# Patient Record
Sex: Female | Born: 1960 | Race: White | Hispanic: No | Marital: Married | Smoking: Former smoker
Health system: Southern US, Community
[De-identification: ages and names within clinical notes are randomized; demographics above are authoritative.]

## PROBLEM LIST (undated history)

## (undated) DIAGNOSIS — E079 Disorder of thyroid, unspecified: Secondary | ICD-10-CM

## (undated) DIAGNOSIS — G43909 Migraine, unspecified, not intractable, without status migrainosus: Secondary | ICD-10-CM

## (undated) DIAGNOSIS — C50911 Malignant neoplasm of unspecified site of right female breast: Secondary | ICD-10-CM

## (undated) DIAGNOSIS — F419 Anxiety disorder, unspecified: Secondary | ICD-10-CM

## (undated) DIAGNOSIS — N809 Endometriosis, unspecified: Secondary | ICD-10-CM

## (undated) DIAGNOSIS — E785 Hyperlipidemia, unspecified: Secondary | ICD-10-CM

## (undated) DIAGNOSIS — Z85850 Personal history of malignant neoplasm of thyroid: Secondary | ICD-10-CM

## (undated) HISTORY — DX: Endometriosis, unspecified: N80.9

## (undated) HISTORY — PX: ABDOMINAL HYSTERECTOMY: SHX81

## (undated) HISTORY — DX: Anxiety disorder, unspecified: F41.9

## (undated) HISTORY — DX: Migraine, unspecified, not intractable, without status migrainosus: G43.909

## (undated) HISTORY — DX: Disorder of thyroid, unspecified: E07.9

## (undated) HISTORY — PX: BREAST RECONSTRUCTION WITH PLACEMENT OF TISSUE EXPANDER AND FLEX HD (ACELLULAR HYDRATED DERMIS): SHX6295

## (undated) HISTORY — PX: TONSILLECTOMY AND ADENOIDECTOMY: SUR1326

## (undated) HISTORY — DX: Hyperlipidemia, unspecified: E78.5

## (undated) HISTORY — PX: ENDOMETRIAL ABLATION: SHX621

## (undated) HISTORY — DX: Malignant neoplasm of unspecified site of right female breast: C50.911

## (undated) HISTORY — PX: BREAST SURGERY: SHX581

## (undated) HISTORY — DX: Personal history of malignant neoplasm of thyroid: Z85.850

---
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
## 2005-01-17 HISTORY — PX: LAMINECTOMY: SHX219

## 2005-02-16 ENCOUNTER — Emergency Department (HOSPITAL_COMMUNITY): Admission: EM | Admit: 2005-02-16 | Discharge: 2005-02-16 | Payer: Self-pay | Admitting: Emergency Medicine

## 2005-03-07 ENCOUNTER — Ambulatory Visit (HOSPITAL_COMMUNITY): Admission: RE | Admit: 2005-03-07 | Discharge: 2005-03-08 | Payer: Self-pay | Admitting: Neurosurgery

## 2005-11-07 ENCOUNTER — Encounter (INDEPENDENT_AMBULATORY_CARE_PROVIDER_SITE_OTHER): Payer: Self-pay | Admitting: *Deleted

## 2005-11-07 ENCOUNTER — Encounter: Admission: RE | Admit: 2005-11-07 | Discharge: 2005-11-07 | Payer: Self-pay | Admitting: Family Medicine

## 2005-11-16 ENCOUNTER — Encounter: Admission: RE | Admit: 2005-11-16 | Discharge: 2005-11-16 | Payer: Self-pay | Admitting: Family Medicine

## 2005-11-21 ENCOUNTER — Ambulatory Visit: Payer: Self-pay | Admitting: Oncology

## 2005-11-24 ENCOUNTER — Ambulatory Visit: Admission: RE | Admit: 2005-11-24 | Discharge: 2005-11-24 | Payer: Self-pay | Admitting: Oncology

## 2005-11-24 ENCOUNTER — Encounter (INDEPENDENT_AMBULATORY_CARE_PROVIDER_SITE_OTHER): Payer: Self-pay | Admitting: *Deleted

## 2005-11-25 ENCOUNTER — Ambulatory Visit (HOSPITAL_COMMUNITY): Admission: RE | Admit: 2005-11-25 | Discharge: 2005-11-25 | Payer: Self-pay | Admitting: Oncology

## 2005-11-28 ENCOUNTER — Ambulatory Visit (HOSPITAL_COMMUNITY): Admission: RE | Admit: 2005-11-28 | Discharge: 2005-11-28 | Payer: Self-pay | Admitting: Oncology

## 2005-11-29 ENCOUNTER — Encounter (INDEPENDENT_AMBULATORY_CARE_PROVIDER_SITE_OTHER): Payer: Self-pay | Admitting: Specialist

## 2005-11-29 ENCOUNTER — Encounter (INDEPENDENT_AMBULATORY_CARE_PROVIDER_SITE_OTHER): Payer: Self-pay | Admitting: Radiology

## 2005-11-29 ENCOUNTER — Encounter: Admission: RE | Admit: 2005-11-29 | Discharge: 2005-11-29 | Payer: Self-pay | Admitting: Surgery

## 2005-11-30 LAB — CBC WITH DIFFERENTIAL/PLATELET
BASO%: 0.6 % (ref 0.0–2.0)
Basophils Absolute: 0 10*3/uL (ref 0.0–0.1)
EOS%: 1.6 % (ref 0.0–7.0)
Eosinophils Absolute: 0.1 10*3/uL (ref 0.0–0.5)
HCT: 38.5 % (ref 34.8–46.6)
HGB: 13.3 g/dL (ref 11.6–15.9)
LYMPH%: 34.4 % (ref 14.0–48.0)
MCH: 31.2 pg (ref 26.0–34.0)
MCHC: 34.6 g/dL (ref 32.0–36.0)
MCV: 90.1 fL (ref 81.0–101.0)
MONO#: 0.4 10*3/uL (ref 0.1–0.9)
MONO%: 7.1 % (ref 0.0–13.0)
NEUT#: 3.5 10*3/uL (ref 1.5–6.5)
NEUT%: 56.3 % (ref 39.6–76.8)
Platelets: 328 10*3/uL (ref 145–400)
RBC: 4.27 10*6/uL (ref 3.70–5.32)
RDW: 12.1 % (ref 11.3–14.5)
WBC: 6.3 10*3/uL (ref 3.9–10.0)
lymph#: 2.2 10*3/uL (ref 0.9–3.3)

## 2005-11-30 LAB — COMPREHENSIVE METABOLIC PANEL
AST: 19 U/L (ref 0–37)
Albumin: 4.4 g/dL (ref 3.5–5.2)
Alkaline Phosphatase: 88 U/L (ref 39–117)
Potassium: 3.9 mEq/L (ref 3.5–5.3)
Sodium: 135 mEq/L (ref 135–145)
Total Bilirubin: 0.4 mg/dL (ref 0.3–1.2)
Total Protein: 7.1 g/dL (ref 6.0–8.3)

## 2005-12-12 LAB — CBC WITH DIFFERENTIAL/PLATELET
BASO%: 0.7 % (ref 0.0–2.0)
Eosinophils Absolute: 0.2 10*3/uL (ref 0.0–0.5)
HCT: 38.2 % (ref 34.8–46.6)
LYMPH%: 38.8 % (ref 14.0–48.0)
MCHC: 34.8 g/dL (ref 32.0–36.0)
MCV: 90.5 fL (ref 81.0–101.0)
MONO%: 8.1 % (ref 0.0–13.0)
NEUT%: 49 % (ref 39.6–76.8)
Platelets: 316 10*3/uL (ref 145–400)
RBC: 4.22 10*6/uL (ref 3.70–5.32)

## 2005-12-12 LAB — COMPREHENSIVE METABOLIC PANEL
Alkaline Phosphatase: 86 U/L (ref 39–117)
CO2: 26 mEq/L (ref 19–32)
Creatinine, Ser: 0.85 mg/dL (ref 0.40–1.20)
Glucose, Bld: 97 mg/dL (ref 70–99)
Sodium: 141 mEq/L (ref 135–145)
Total Bilirubin: 0.4 mg/dL (ref 0.3–1.2)
Total Protein: 6.6 g/dL (ref 6.0–8.3)

## 2005-12-12 LAB — LACTATE DEHYDROGENASE: LDH: 150 U/L (ref 94–250)

## 2005-12-12 LAB — CANCER ANTIGEN 27.29: CA 27.29: 19 U/mL (ref 0–39)

## 2005-12-13 LAB — PROTEIN / CREATININE RATIO, URINE
Creatinine, Urine: 50.4 mg/dL
Protein Creatinine Ratio: 0.12 (ref <0.15)

## 2005-12-14 ENCOUNTER — Ambulatory Visit (HOSPITAL_COMMUNITY): Admission: RE | Admit: 2005-12-14 | Discharge: 2005-12-14 | Payer: Self-pay | Admitting: Surgery

## 2005-12-21 LAB — CBC WITH DIFFERENTIAL/PLATELET
Basophils Absolute: 0.1 10*3/uL (ref 0.0–0.1)
EOS%: 0.1 % (ref 0.0–7.0)
HCT: 38.1 % (ref 34.8–46.6)
HGB: 13.1 g/dL (ref 11.6–15.9)
LYMPH%: 9.6 % — ABNORMAL LOW (ref 14.0–48.0)
MCH: 30.8 pg (ref 26.0–34.0)
MCV: 89.2 fL (ref 81.0–101.0)
MONO%: 0.4 % (ref 0.0–13.0)
NEUT%: 89.4 % — ABNORMAL HIGH (ref 39.6–76.8)
Platelets: 276 10*3/uL (ref 145–400)

## 2005-12-28 LAB — CBC WITH DIFFERENTIAL/PLATELET
EOS%: 1.2 % (ref 0.0–7.0)
LYMPH%: 78.1 % — ABNORMAL HIGH (ref 14.0–48.0)
MCH: 30.9 pg (ref 26.0–34.0)
MCV: 87.4 fL (ref 81.0–101.0)
MONO%: 2.4 % (ref 0.0–13.0)
RBC: 3.71 10*6/uL (ref 3.70–5.32)
RDW: 11.6 % (ref 11.3–14.5)

## 2006-01-09 LAB — COMPREHENSIVE METABOLIC PANEL
ALT: 20 U/L (ref 0–35)
AST: 19 U/L (ref 0–37)
Albumin: 4.3 g/dL (ref 3.5–5.2)
CO2: 24 mEq/L (ref 19–32)
Calcium: 9 mg/dL (ref 8.4–10.5)
Chloride: 101 mEq/L (ref 96–112)
Potassium: 4.3 mEq/L (ref 3.5–5.3)
Total Protein: 6.9 g/dL (ref 6.0–8.3)

## 2006-01-09 LAB — CBC WITH DIFFERENTIAL/PLATELET
BASO%: 2.5 % — ABNORMAL HIGH (ref 0.0–2.0)
Eosinophils Absolute: 0.1 10*3/uL (ref 0.0–0.5)
MCHC: 34.6 g/dL (ref 32.0–36.0)
MCV: 89 fL (ref 81.0–101.0)
MONO#: 0.2 10*3/uL (ref 0.1–0.9)
MONO%: 1 % (ref 0.0–13.0)
NEUT#: 13.4 10*3/uL — ABNORMAL HIGH (ref 1.5–6.5)
RBC: 4.12 10*6/uL (ref 3.70–5.32)
RDW: 12.5 % (ref 11.3–14.5)
WBC: 17.7 10*3/uL — ABNORMAL HIGH (ref 3.9–10.0)

## 2006-01-09 LAB — PROTEIN / CREATININE RATIO, URINE: Creatinine, Urine: 45.3 mg/dL

## 2006-01-11 LAB — CREATININE, URINE, RANDOM: Creatinine, Urine: 45.3 mg/dL

## 2006-01-17 ENCOUNTER — Ambulatory Visit: Payer: Self-pay | Admitting: Oncology

## 2006-01-18 LAB — CBC WITH DIFFERENTIAL/PLATELET
BASO%: 2.2 % — ABNORMAL HIGH (ref 0.0–2.0)
Basophils Absolute: 0.1 10*3/uL (ref 0.0–0.1)
EOS%: 1 % (ref 0.0–7.0)
HGB: 10.9 g/dL — ABNORMAL LOW (ref 11.6–15.9)
MCH: 30.2 pg (ref 26.0–34.0)
MCHC: 35.3 g/dL (ref 32.0–36.0)
RDW: 12.3 % (ref 11.3–14.5)
lymph#: 1.8 10*3/uL (ref 0.9–3.3)

## 2006-01-20 ENCOUNTER — Emergency Department (HOSPITAL_COMMUNITY): Admission: EM | Admit: 2006-01-20 | Discharge: 2006-01-21 | Payer: Self-pay | Admitting: Emergency Medicine

## 2006-01-31 LAB — CBC WITH DIFFERENTIAL/PLATELET
Basophils Absolute: 0 10*3/uL (ref 0.0–0.1)
Eosinophils Absolute: 0.1 10*3/uL (ref 0.0–0.5)
HGB: 11.2 g/dL — ABNORMAL LOW (ref 11.6–15.9)
MONO#: 0.7 10*3/uL (ref 0.1–0.9)
NEUT#: 4.3 10*3/uL (ref 1.5–6.5)
RDW: 16.2 % — ABNORMAL HIGH (ref 11.3–14.5)
lymph#: 1.7 10*3/uL (ref 0.9–3.3)

## 2006-01-31 LAB — COMPREHENSIVE METABOLIC PANEL
Albumin: 4.3 g/dL (ref 3.5–5.2)
CO2: 24 mEq/L (ref 19–32)
Glucose, Bld: 85 mg/dL (ref 70–99)
Potassium: 3.8 mEq/L (ref 3.5–5.3)
Sodium: 137 mEq/L (ref 135–145)
Total Protein: 6.7 g/dL (ref 6.0–8.3)

## 2006-01-31 LAB — PROTEIN / CREATININE RATIO, URINE: Creatinine, Urine: 132 mg/dL

## 2006-02-01 ENCOUNTER — Ambulatory Visit (HOSPITAL_COMMUNITY): Admission: RE | Admit: 2006-02-01 | Discharge: 2006-02-01 | Payer: Self-pay | Admitting: Oncology

## 2006-02-07 ENCOUNTER — Ambulatory Visit: Payer: Self-pay | Admitting: Oncology

## 2006-02-07 LAB — CBC WITH DIFFERENTIAL/PLATELET
BASO%: 3.1 % — ABNORMAL HIGH (ref 0.0–2.0)
HCT: 32.7 % — ABNORMAL LOW (ref 34.8–46.6)
LYMPH%: 39.5 % (ref 14.0–48.0)
MCH: 32.2 pg (ref 26.0–34.0)
MCHC: 35.4 g/dL (ref 32.0–36.0)
MCV: 90.9 fL (ref 81.0–101.0)
MONO#: 0.3 10*3/uL (ref 0.1–0.9)
MONO%: 7.5 % (ref 0.0–13.0)
NEUT%: 48.6 % (ref 39.6–76.8)
Platelets: 497 10*3/uL — ABNORMAL HIGH (ref 145–400)
RBC: 3.6 10*6/uL — ABNORMAL LOW (ref 3.70–5.32)
WBC: 4.6 10*3/uL (ref 3.9–10.0)

## 2006-02-21 LAB — CBC WITH DIFFERENTIAL/PLATELET
BASO%: 0.2 % (ref 0.0–2.0)
EOS%: 2.9 % (ref 0.0–7.0)
HCT: 32.6 % — ABNORMAL LOW (ref 34.8–46.6)
LYMPH%: 15.7 % (ref 14.0–48.0)
MCH: 32.5 pg (ref 26.0–34.0)
MCHC: 34.9 g/dL (ref 32.0–36.0)
NEUT%: 69.9 % (ref 39.6–76.8)
Platelets: 533 10*3/uL — ABNORMAL HIGH (ref 145–400)
RBC: 3.5 10*6/uL — ABNORMAL LOW (ref 3.70–5.32)
WBC: 9.8 10*3/uL (ref 3.9–10.0)
lymph#: 1.5 10*3/uL (ref 0.9–3.3)

## 2006-02-21 LAB — COMPREHENSIVE METABOLIC PANEL
ALT: 21 U/L (ref 0–35)
AST: 21 U/L (ref 0–37)
Creatinine, Ser: 0.9 mg/dL (ref 0.40–1.20)
Sodium: 136 mEq/L (ref 135–145)
Total Bilirubin: 0.4 mg/dL (ref 0.3–1.2)
Total Protein: 6.5 g/dL (ref 6.0–8.3)

## 2006-02-21 LAB — URINALYSIS, MICROSCOPIC - CHCC
Bilirubin (Urine): NEGATIVE
Ketones: NEGATIVE mg/dL
RBC count: NEGATIVE (ref 0–2)
WBC, UA: NEGATIVE (ref 0–2)

## 2006-02-28 LAB — CBC WITH DIFFERENTIAL/PLATELET
Eosinophils Absolute: 0.2 10*3/uL (ref 0.0–0.5)
MONO#: 0.3 10*3/uL (ref 0.1–0.9)
NEUT#: 8.9 10*3/uL — ABNORMAL HIGH (ref 1.5–6.5)
RBC: 3.77 10*6/uL (ref 3.70–5.32)
RDW: 16.1 % — ABNORMAL HIGH (ref 11.3–14.5)
WBC: 11.1 10*3/uL — ABNORMAL HIGH (ref 3.9–10.0)

## 2006-02-28 LAB — COMPREHENSIVE METABOLIC PANEL
ALT: 21 U/L (ref 0–35)
Albumin: 4.1 g/dL (ref 3.5–5.2)
Alkaline Phosphatase: 116 U/L (ref 39–117)
CO2: 23 mEq/L (ref 19–32)
Glucose, Bld: 118 mg/dL — ABNORMAL HIGH (ref 70–99)
Potassium: 4.6 mEq/L (ref 3.5–5.3)
Sodium: 136 mEq/L (ref 135–145)
Total Protein: 6.7 g/dL (ref 6.0–8.3)

## 2006-02-28 LAB — PROTEIN / CREATININE RATIO, URINE
Creatinine, Urine: 92.5 mg/dL
Total Protein, Urine: 4 mg/dL

## 2006-03-08 LAB — CBC WITH DIFFERENTIAL/PLATELET
Basophils Absolute: 0.1 10*3/uL (ref 0.0–0.1)
EOS%: 2.9 % (ref 0.0–7.0)
HGB: 11.8 g/dL (ref 11.6–15.9)
LYMPH%: 46.2 % (ref 14.0–48.0)
MCH: 31.5 pg (ref 26.0–34.0)
MCV: 92.8 fL (ref 81.0–101.0)
MONO%: 9 % (ref 0.0–13.0)
NEUT%: 39.5 % — ABNORMAL LOW (ref 39.6–76.8)
Platelets: 331 10*3/uL (ref 145–400)
RDW: 15.3 % — ABNORMAL HIGH (ref 11.3–14.5)

## 2006-03-14 ENCOUNTER — Encounter: Admission: RE | Admit: 2006-03-14 | Discharge: 2006-03-14 | Payer: Self-pay | Admitting: Oncology

## 2006-03-20 LAB — CBC WITH DIFFERENTIAL/PLATELET
BASO%: 0.4 % (ref 0.0–2.0)
EOS%: 1.2 % (ref 0.0–7.0)
LYMPH%: 28.6 % (ref 14.0–48.0)
MCH: 32.2 pg (ref 26.0–34.0)
MCHC: 34.2 g/dL (ref 32.0–36.0)
MCV: 94.1 fL (ref 81.0–101.0)
MONO%: 13.5 % — ABNORMAL HIGH (ref 0.0–13.0)
NEUT#: 4 10*3/uL (ref 1.5–6.5)
Platelets: 337 10*3/uL (ref 145–400)
RBC: 3.4 10*6/uL — ABNORMAL LOW (ref 3.70–5.32)
RDW: 21.3 % — ABNORMAL HIGH (ref 11.3–14.5)

## 2006-03-20 LAB — COMPREHENSIVE METABOLIC PANEL
Albumin: 4 g/dL (ref 3.5–5.2)
Alkaline Phosphatase: 83 U/L (ref 39–117)
CO2: 26 mEq/L (ref 19–32)
Glucose, Bld: 72 mg/dL (ref 70–99)
Potassium: 4.2 mEq/L (ref 3.5–5.3)
Sodium: 140 mEq/L (ref 135–145)
Total Protein: 6.3 g/dL (ref 6.0–8.3)

## 2006-03-20 LAB — URIC ACID: Uric Acid, Serum: 4.7 mg/dL (ref 2.4–7.0)

## 2006-03-20 LAB — PROTEIN / CREATININE RATIO, URINE: Creatinine, Urine: 50.3 mg/dL

## 2006-03-22 ENCOUNTER — Ambulatory Visit: Payer: Self-pay | Admitting: Oncology

## 2006-04-11 LAB — COMPREHENSIVE METABOLIC PANEL
Alkaline Phosphatase: 106 U/L (ref 39–117)
BUN: 22 mg/dL (ref 6–23)
Creatinine, Ser: 0.84 mg/dL (ref 0.40–1.20)
Glucose, Bld: 98 mg/dL (ref 70–99)
Sodium: 138 mEq/L (ref 135–145)
Total Bilirubin: 0.3 mg/dL (ref 0.3–1.2)
Total Protein: 7 g/dL (ref 6.0–8.3)

## 2006-04-11 LAB — CBC WITH DIFFERENTIAL/PLATELET
Eosinophils Absolute: 0 10*3/uL (ref 0.0–0.5)
HCT: 36.1 % (ref 34.8–46.6)
LYMPH%: 27.1 % (ref 14.0–48.0)
MCV: 94.7 fL (ref 81.0–101.0)
MONO%: 11.8 % (ref 0.0–13.0)
NEUT#: 4.3 10*3/uL (ref 1.5–6.5)
NEUT%: 60.5 % (ref 39.6–76.8)
Platelets: 289 10*3/uL (ref 145–400)
RBC: 3.81 10*6/uL (ref 3.70–5.32)

## 2006-04-11 LAB — PROTEIN / CREATININE RATIO, URINE
Creatinine, Urine: 94 mg/dL
Protein Creatinine Ratio: 0.03 (ref <0.15)
Total Protein, Urine: 3 mg/dL

## 2006-05-02 LAB — COMPREHENSIVE METABOLIC PANEL
ALT: 24 U/L (ref 0–35)
AST: 23 U/L (ref 0–37)
Albumin: 4.3 g/dL (ref 3.5–5.2)
Alkaline Phosphatase: 110 U/L (ref 39–117)
BUN: 13 mg/dL (ref 6–23)
CO2: 23 mEq/L (ref 19–32)
Calcium: 9.1 mg/dL (ref 8.4–10.5)
Chloride: 105 mEq/L (ref 96–112)
Creatinine, Ser: 0.78 mg/dL (ref 0.40–1.20)
Glucose, Bld: 106 mg/dL — ABNORMAL HIGH (ref 70–99)
Potassium: 4.6 mEq/L (ref 3.5–5.3)
Sodium: 139 mEq/L (ref 135–145)
Total Bilirubin: 0.3 mg/dL (ref 0.3–1.2)
Total Protein: 6.6 g/dL (ref 6.0–8.3)

## 2006-05-02 LAB — CBC WITH DIFFERENTIAL/PLATELET
Basophils Absolute: 0 10*3/uL (ref 0.0–0.1)
Eosinophils Absolute: 0.1 10*3/uL (ref 0.0–0.5)
HGB: 12.4 g/dL (ref 11.6–15.9)
MCV: 93.8 fL (ref 81.0–101.0)
MONO%: 17 % — ABNORMAL HIGH (ref 0.0–13.0)
NEUT#: 2.8 10*3/uL (ref 1.5–6.5)
RBC: 3.82 10*6/uL (ref 3.70–5.32)
RDW: 18.9 % — ABNORMAL HIGH (ref 11.3–14.5)
WBC: 5.9 10*3/uL (ref 3.9–10.0)
lymph#: 1.9 10*3/uL (ref 0.9–3.3)

## 2006-05-18 ENCOUNTER — Ambulatory Visit: Payer: Self-pay | Admitting: Oncology

## 2006-05-22 LAB — COMPREHENSIVE METABOLIC PANEL
ALT: 20 U/L (ref 0–35)
AST: 24 U/L (ref 0–37)
Albumin: 4.2 g/dL (ref 3.5–5.2)
Alkaline Phosphatase: 104 U/L (ref 39–117)
Chloride: 102 mEq/L (ref 96–112)
Potassium: 4.3 mEq/L (ref 3.5–5.3)
Sodium: 136 mEq/L (ref 135–145)
Total Protein: 6.6 g/dL (ref 6.0–8.3)

## 2006-05-22 LAB — CBC WITH DIFFERENTIAL/PLATELET
EOS%: 1.5 % (ref 0.0–7.0)
MCH: 32.3 pg (ref 26.0–34.0)
MCV: 92.9 fL (ref 81.0–101.0)
MONO%: 19 % — ABNORMAL HIGH (ref 0.0–13.0)
NEUT#: 2.4 10*3/uL (ref 1.5–6.5)
RBC: 3.75 10*6/uL (ref 3.70–5.32)
RDW: 18.5 % — ABNORMAL HIGH (ref 11.3–14.5)
lymph#: 1.4 10*3/uL (ref 0.9–3.3)

## 2006-05-31 LAB — COMPREHENSIVE METABOLIC PANEL
AST: 16 U/L (ref 0–37)
Albumin: 4.3 g/dL (ref 3.5–5.2)
Alkaline Phosphatase: 85 U/L (ref 39–117)
Calcium: 9.1 mg/dL (ref 8.4–10.5)
Chloride: 105 mEq/L (ref 96–112)
Glucose, Bld: 89 mg/dL (ref 70–99)
Potassium: 4.1 mEq/L (ref 3.5–5.3)
Sodium: 138 mEq/L (ref 135–145)
Total Protein: 6.6 g/dL (ref 6.0–8.3)

## 2006-05-31 LAB — CBC WITH DIFFERENTIAL/PLATELET
BASO%: 0.4 % (ref 0.0–2.0)
Basophils Absolute: 0 10*3/uL (ref 0.0–0.1)
EOS%: 3.8 % (ref 0.0–7.0)
MCH: 32 pg (ref 26.0–34.0)
MCHC: 34.9 g/dL (ref 32.0–36.0)
MCV: 91.7 fL (ref 81.0–101.0)
MONO%: 1.2 % (ref 0.0–13.0)
RBC: 3.56 10*6/uL — ABNORMAL LOW (ref 3.70–5.32)
RDW: 17.1 % — ABNORMAL HIGH (ref 11.3–14.5)

## 2006-06-14 ENCOUNTER — Encounter: Admission: RE | Admit: 2006-06-14 | Discharge: 2006-06-14 | Payer: Self-pay | Admitting: Oncology

## 2006-06-14 ENCOUNTER — Ambulatory Visit: Payer: Self-pay

## 2006-06-14 ENCOUNTER — Encounter: Payer: Self-pay | Admitting: Oncology

## 2006-06-14 LAB — COMPREHENSIVE METABOLIC PANEL
ALT: 23 U/L (ref 0–35)
AST: 26 U/L (ref 0–37)
Alkaline Phosphatase: 95 U/L (ref 39–117)
Sodium: 137 mEq/L (ref 135–145)
Total Bilirubin: 0.3 mg/dL (ref 0.3–1.2)
Total Protein: 6.8 g/dL (ref 6.0–8.3)

## 2006-06-14 LAB — CBC WITH DIFFERENTIAL/PLATELET
BASO%: 1.1 % (ref 0.0–2.0)
EOS%: 1.4 % (ref 0.0–7.0)
MCH: 32.1 pg (ref 26.0–34.0)
MCV: 92.4 fL (ref 81.0–101.0)
MONO%: 28.4 % — ABNORMAL HIGH (ref 0.0–13.0)
RBC: 3.61 10*6/uL — ABNORMAL LOW (ref 3.70–5.32)
RDW: 16.9 % — ABNORMAL HIGH (ref 11.3–14.5)

## 2006-06-14 LAB — RESEARCH LABS

## 2006-06-26 ENCOUNTER — Encounter (INDEPENDENT_AMBULATORY_CARE_PROVIDER_SITE_OTHER): Payer: Self-pay | Admitting: Surgery

## 2006-06-26 ENCOUNTER — Encounter: Admission: RE | Admit: 2006-06-26 | Discharge: 2006-06-26 | Payer: Self-pay | Admitting: Surgery

## 2006-06-26 ENCOUNTER — Ambulatory Visit (HOSPITAL_BASED_OUTPATIENT_CLINIC_OR_DEPARTMENT_OTHER): Admission: RE | Admit: 2006-06-26 | Discharge: 2006-06-27 | Payer: Self-pay | Admitting: Surgery

## 2006-07-26 ENCOUNTER — Ambulatory Visit: Payer: Self-pay | Admitting: Oncology

## 2006-07-28 ENCOUNTER — Ambulatory Visit: Admission: RE | Admit: 2006-07-28 | Discharge: 2006-10-03 | Payer: Self-pay | Admitting: Radiation Oncology

## 2006-07-28 LAB — CBC WITH DIFFERENTIAL/PLATELET
Eosinophils Absolute: 0.3 10*3/uL (ref 0.0–0.5)
LYMPH%: 37.6 % (ref 14.0–48.0)
MCHC: 34.6 g/dL (ref 32.0–36.0)
MCV: 92.5 fL (ref 81.0–101.0)
MONO%: 9.9 % (ref 0.0–13.0)
NEUT#: 2.3 10*3/uL (ref 1.5–6.5)
Platelets: 263 10*3/uL (ref 145–400)
RBC: 4.03 10*6/uL (ref 3.70–5.32)

## 2006-07-28 LAB — PROTEIN / CREATININE RATIO, URINE: Creatinine, Urine: 66.8 mg/dL

## 2006-08-01 LAB — COMPREHENSIVE METABOLIC PANEL
BUN: 10 mg/dL (ref 6–23)
CO2: 22 mEq/L (ref 19–32)
Creatinine, Ser: 0.87 mg/dL (ref 0.40–1.20)
Glucose, Bld: 99 mg/dL (ref 70–99)
Sodium: 140 mEq/L (ref 135–145)
Total Bilirubin: 0.4 mg/dL (ref 0.3–1.2)
Total Protein: 6.9 g/dL (ref 6.0–8.3)

## 2006-09-06 ENCOUNTER — Ambulatory Visit (HOSPITAL_COMMUNITY): Admission: RE | Admit: 2006-09-06 | Discharge: 2006-09-07 | Payer: Self-pay | Admitting: Surgery

## 2006-09-06 ENCOUNTER — Encounter (INDEPENDENT_AMBULATORY_CARE_PROVIDER_SITE_OTHER): Payer: Self-pay | Admitting: Surgery

## 2006-10-03 ENCOUNTER — Ambulatory Visit: Payer: Self-pay | Admitting: Oncology

## 2006-10-05 LAB — CBC WITH DIFFERENTIAL/PLATELET
Basophils Absolute: 0 10*3/uL (ref 0.0–0.1)
Eosinophils Absolute: 0.6 10*3/uL — ABNORMAL HIGH (ref 0.0–0.5)
HGB: 12.1 g/dL (ref 11.6–15.9)
LYMPH%: 35.1 % (ref 14.0–48.0)
MONO#: 0.6 10*3/uL (ref 0.1–0.9)
NEUT#: 2.8 10*3/uL (ref 1.5–6.5)
Platelets: 283 10*3/uL (ref 145–400)
RBC: 3.89 10*6/uL (ref 3.70–5.32)
WBC: 6.1 10*3/uL (ref 3.9–10.0)

## 2006-10-05 LAB — COMPREHENSIVE METABOLIC PANEL
Albumin: 4.4 g/dL (ref 3.5–5.2)
BUN: 14 mg/dL (ref 6–23)
CO2: 22 mEq/L (ref 19–32)
Glucose, Bld: 82 mg/dL (ref 70–99)
Potassium: 4.3 mEq/L (ref 3.5–5.3)
Sodium: 135 mEq/L (ref 135–145)
Total Protein: 6.8 g/dL (ref 6.0–8.3)

## 2006-10-05 LAB — PROTEIN / CREATININE RATIO, URINE
Creatinine, Urine: 32.6 mg/dL
Total Protein, Urine: 1 mg/dL

## 2006-10-17 ENCOUNTER — Ambulatory Visit: Admission: RE | Admit: 2006-10-17 | Discharge: 2006-12-06 | Payer: Self-pay | Admitting: Radiation Oncology

## 2006-11-20 ENCOUNTER — Ambulatory Visit: Payer: Self-pay | Admitting: Oncology

## 2006-11-20 LAB — PROTEIN / CREATININE RATIO, URINE
Protein Creatinine Ratio: 0.03 (ref <0.15)
Total Protein, Urine: 2 mg/dL

## 2006-11-29 LAB — ESTRADIOL, ULTRA SENS: Estradiol, Ultra Sensitive: 13 pg/mL

## 2006-12-25 ENCOUNTER — Encounter: Admission: RE | Admit: 2006-12-25 | Discharge: 2006-12-25 | Payer: Self-pay | Admitting: Oncology

## 2007-01-24 ENCOUNTER — Ambulatory Visit: Payer: Self-pay | Admitting: Oncology

## 2007-01-26 LAB — CBC WITH DIFFERENTIAL/PLATELET
BASO%: 0.8 % (ref 0.0–2.0)
EOS%: 3.7 % (ref 0.0–7.0)
HCT: 39.9 % (ref 34.8–46.6)
LYMPH%: 41.9 % (ref 14.0–48.0)
MCH: 30.7 pg (ref 26.0–34.0)
MCHC: 35 g/dL (ref 32.0–36.0)
MONO%: 7 % (ref 0.0–13.0)
NEUT%: 46.6 % (ref 39.6–76.8)
lymph#: 2.6 10*3/uL (ref 0.9–3.3)

## 2007-01-26 LAB — COMPREHENSIVE METABOLIC PANEL
Albumin: 4.6 g/dL (ref 3.5–5.2)
BUN: 13 mg/dL (ref 6–23)
CO2: 24 mEq/L (ref 19–32)
Calcium: 9.5 mg/dL (ref 8.4–10.5)
Chloride: 102 mEq/L (ref 96–112)
Creatinine, Ser: 0.92 mg/dL (ref 0.40–1.20)
Glucose, Bld: 85 mg/dL (ref 70–99)
Potassium: 3.9 mEq/L (ref 3.5–5.3)

## 2007-01-26 LAB — LACTATE DEHYDROGENASE: LDH: 175 U/L (ref 94–250)

## 2007-01-30 LAB — VITAMIN D PNL(25-HYDRXY+1,25-DIHY)-BLD
Vit D, 1,25-Dihydroxy: 33 pg/mL (ref 6–62)
Vit D, 25-Hydroxy: 20 ng/mL — ABNORMAL LOW (ref 30–89)

## 2007-02-14 ENCOUNTER — Ambulatory Visit (HOSPITAL_BASED_OUTPATIENT_CLINIC_OR_DEPARTMENT_OTHER): Admission: RE | Admit: 2007-02-14 | Discharge: 2007-02-14 | Payer: Self-pay | Admitting: Surgery

## 2007-06-12 ENCOUNTER — Ambulatory Visit: Payer: Self-pay | Admitting: Oncology

## 2007-06-12 ENCOUNTER — Encounter: Payer: Self-pay | Admitting: Oncology

## 2007-06-12 ENCOUNTER — Ambulatory Visit: Payer: Self-pay

## 2007-06-14 LAB — CBC WITH DIFFERENTIAL/PLATELET
BASO%: 0.6 % (ref 0.0–2.0)
EOS%: 1.9 % (ref 0.0–7.0)
HCT: 37.7 % (ref 34.8–46.6)
LYMPH%: 37.9 % (ref 14.0–48.0)
MCH: 30.8 pg (ref 26.0–34.0)
MCHC: 34.9 g/dL (ref 32.0–36.0)
MCV: 88.3 fL (ref 81.0–101.0)
MONO%: 8.4 % (ref 0.0–13.0)
NEUT%: 51.2 % (ref 39.6–76.8)
Platelets: 300 10*3/uL (ref 145–400)
lymph#: 1.8 10*3/uL (ref 0.9–3.3)

## 2007-06-14 LAB — COMPREHENSIVE METABOLIC PANEL
ALT: 20 U/L (ref 0–35)
AST: 24 U/L (ref 0–37)
Creatinine, Ser: 0.85 mg/dL (ref 0.40–1.20)
Total Bilirubin: 0.5 mg/dL (ref 0.3–1.2)

## 2007-06-14 LAB — CANCER ANTIGEN 27.29: CA 27.29: 26 U/mL (ref 0–39)

## 2007-12-11 ENCOUNTER — Ambulatory Visit: Payer: Self-pay | Admitting: Oncology

## 2007-12-14 LAB — CBC WITH DIFFERENTIAL/PLATELET
Eosinophils Absolute: 0.1 10*3/uL (ref 0.0–0.5)
HGB: 12.3 g/dL (ref 11.6–15.9)
MCV: 89.4 fL (ref 81.0–101.0)
MONO%: 7.9 % (ref 0.0–13.0)
NEUT#: 2.4 10*3/uL (ref 1.5–6.5)
RBC: 3.94 10*6/uL (ref 3.70–5.32)
RDW: 12.3 % (ref 11.3–14.5)
WBC: 4.4 10*3/uL (ref 3.9–10.0)
lymph#: 1.5 10*3/uL (ref 0.9–3.3)

## 2007-12-17 LAB — COMPREHENSIVE METABOLIC PANEL
AST: 21 U/L (ref 0–37)
Albumin: 4 g/dL (ref 3.5–5.2)
Alkaline Phosphatase: 103 U/L (ref 39–117)
Calcium: 8.8 mg/dL (ref 8.4–10.5)
Chloride: 101 mEq/L (ref 96–112)
Glucose, Bld: 122 mg/dL — ABNORMAL HIGH (ref 70–99)
Potassium: 3.9 mEq/L (ref 3.5–5.3)
Sodium: 134 mEq/L — ABNORMAL LOW (ref 135–145)
Total Protein: 6.5 g/dL (ref 6.0–8.3)

## 2007-12-17 LAB — VITAMIN D 25 HYDROXY (VIT D DEFICIENCY, FRACTURES): Vit D, 25-Hydroxy: 35 ng/mL (ref 30–89)

## 2008-01-02 ENCOUNTER — Encounter: Admission: RE | Admit: 2008-01-02 | Discharge: 2008-01-02 | Payer: Self-pay | Admitting: Surgery

## 2008-01-04 ENCOUNTER — Encounter: Admission: RE | Admit: 2008-01-04 | Discharge: 2008-01-04 | Payer: Self-pay | Admitting: Surgery

## 2008-01-07 IMAGING — CT CT PELVIS W/ CM
1 of 3 series · 13 of 32 positions shown, 18 images · IV contrast (omnipaque)
Comparison: PET CT done today and breast MRI performed 11/16/2005.

CLINICAL DATA: Recent diagnosis of right breast cancer on percutaneous biopsy.  Pretreatment staging
 CHEST CT WITH CONTRAST:
TECHNIQUE: Multidetector CT imaging of the chest was performed following the standard protocol during bolus administration of intravenous contrast.
 Contrast:  417cc Omnipaque 300.  Oral contrast was given.
TECHNIQUE: Multidetector CT imaging of the abdomen was performed following the standard protocol during bolus administration of intravenous contrast.
 Contrast:  417cc Omnipaque 300.
TECHNIQUE: Multidetector CT imaging of the pelvis was performed following the standard protocol during bolus administration of intravenous contrast.

[Series 2: cap 5.0 b40f st · axial · 0.66mm/px · z∈[-505,+80]mm · 13 of 133 slices shown, 18 images]
[im 8/133  soft-tissue]
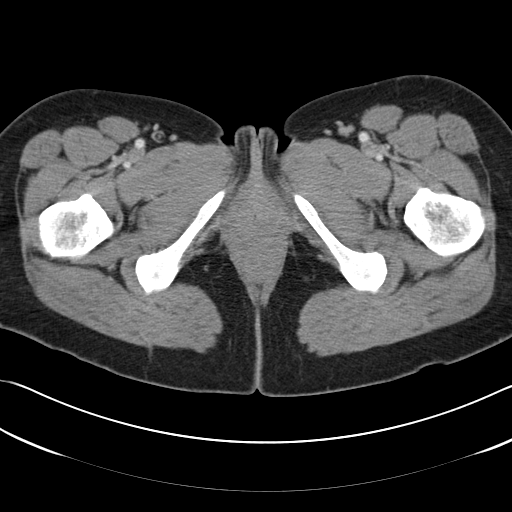
[im 8/133  bone]
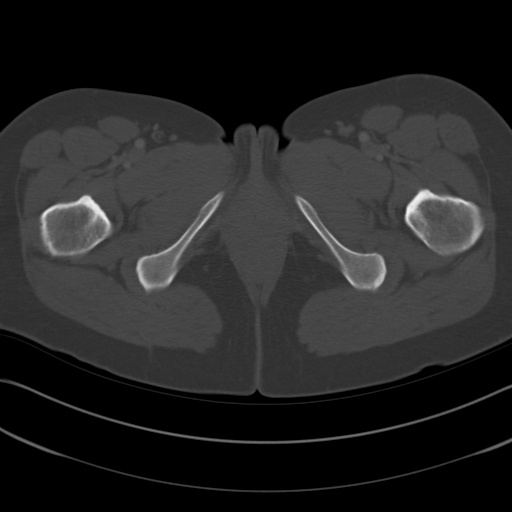
[im 23/133  soft-tissue]
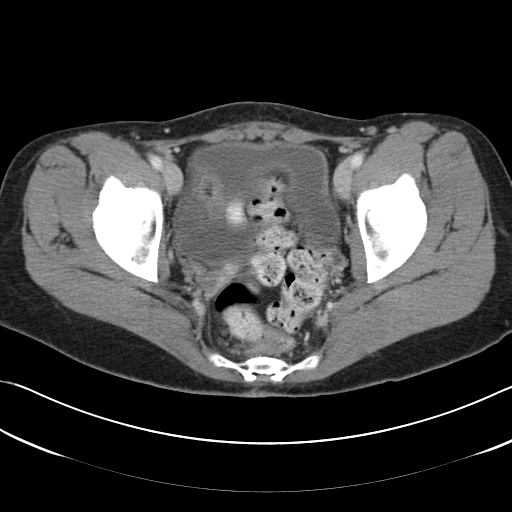
[im 30/133  soft-tissue]
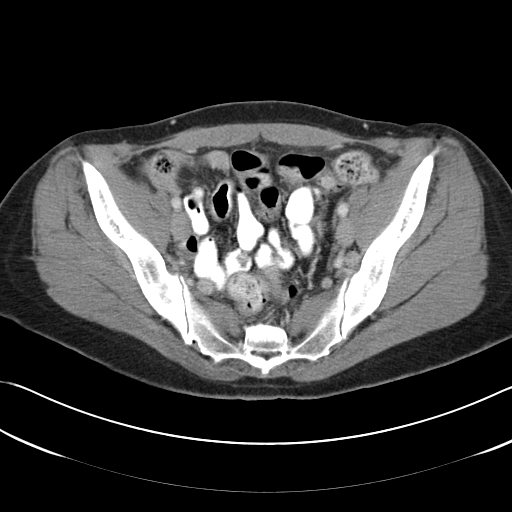
[im 37/133  soft-tissue]
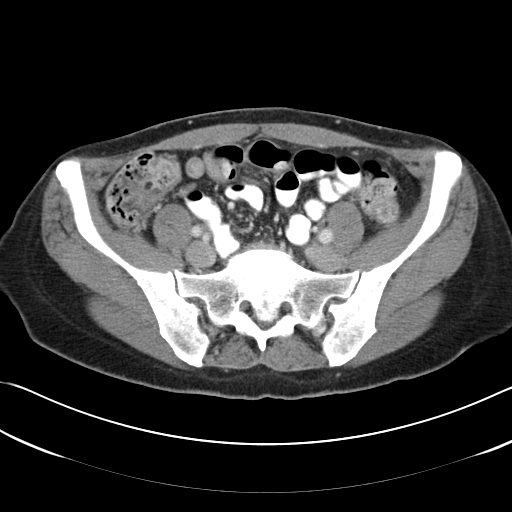
[im 52/133  soft-tissue]
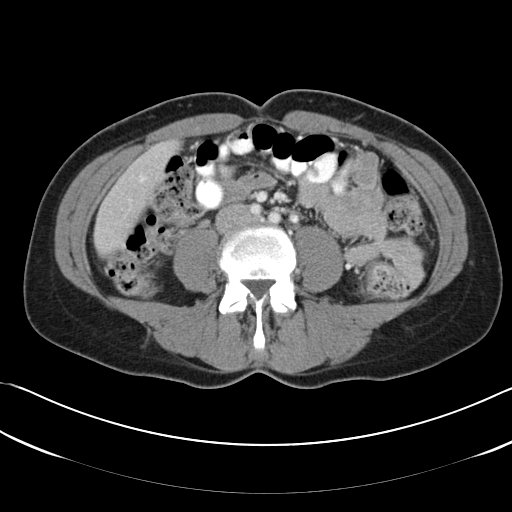
[im 59/133  soft-tissue]
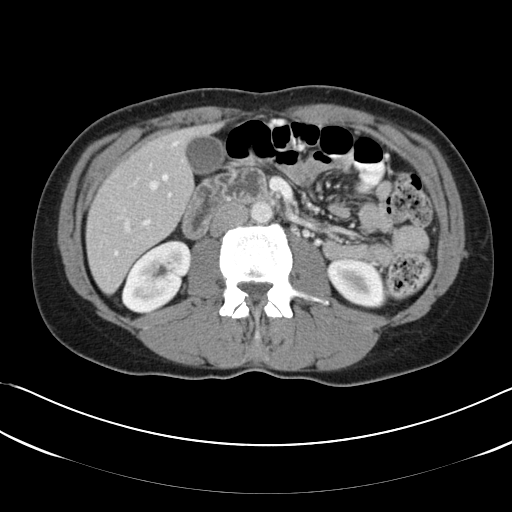
[im 74/133  soft-tissue]
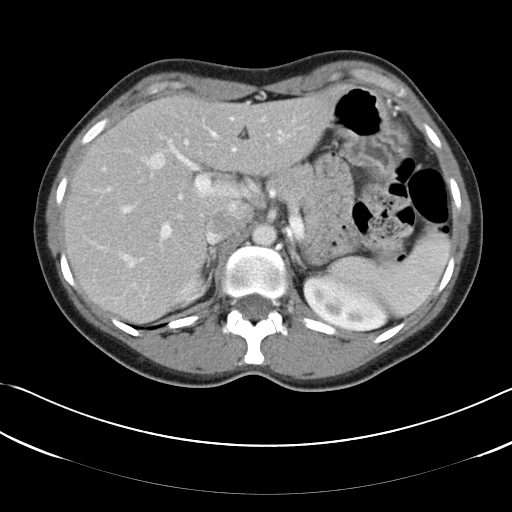
[im 81/133  soft-tissue]
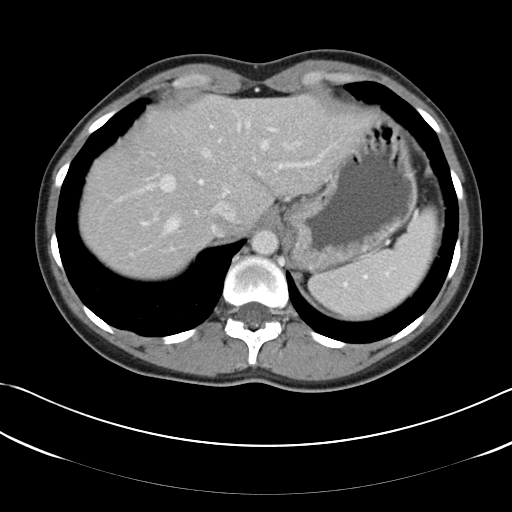
[im 96/133  soft-tissue]
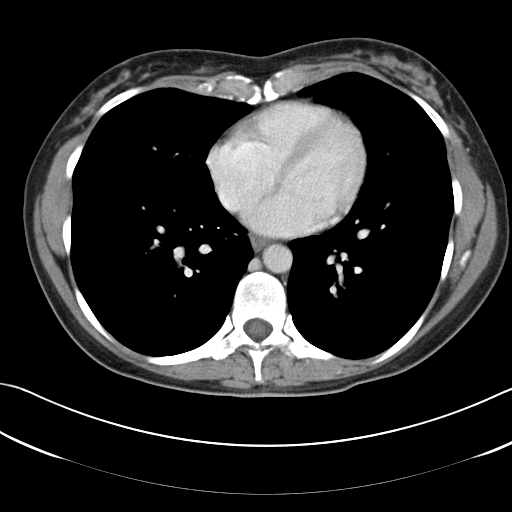
[im 96/133  bone]
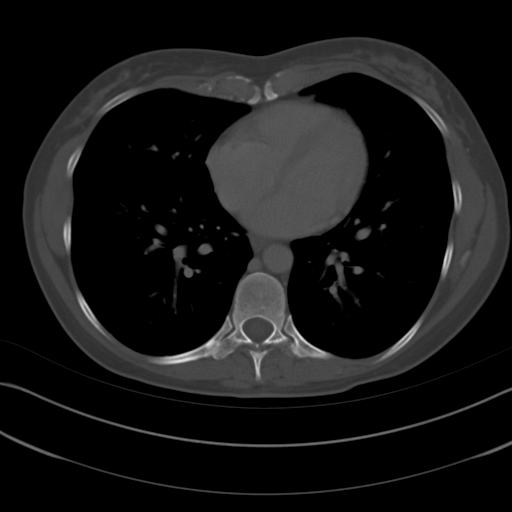
[im 103/133  soft-tissue]
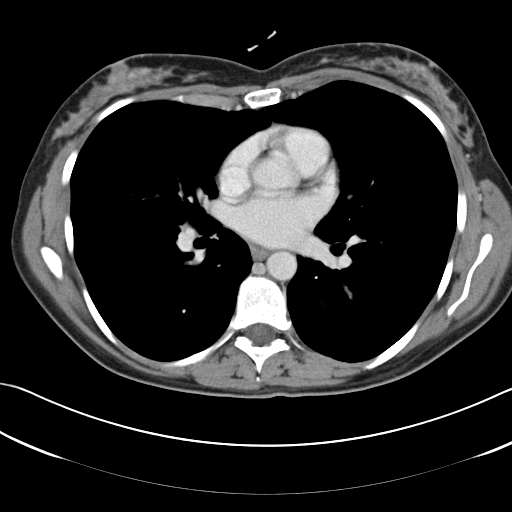
[im 103/133  lung]
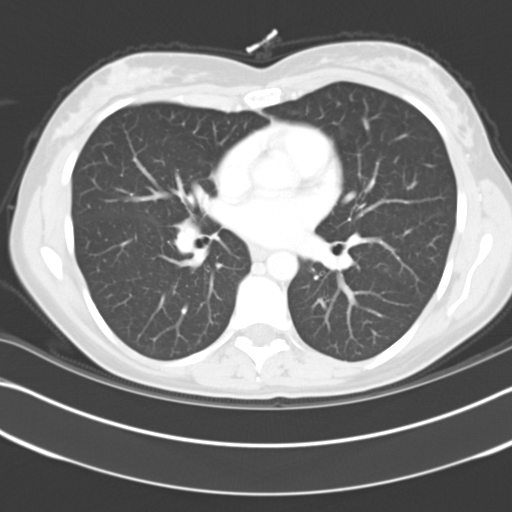
[im 111/133  soft-tissue]
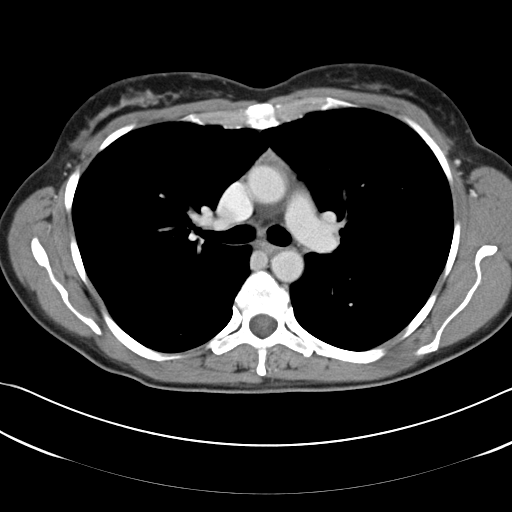
[im 111/133  lung]
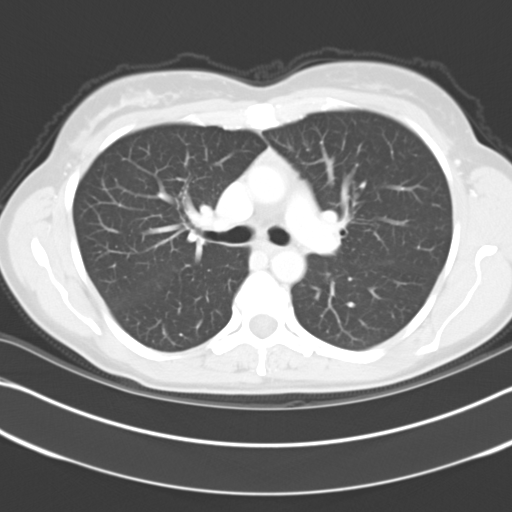
[im 118/133  lung]
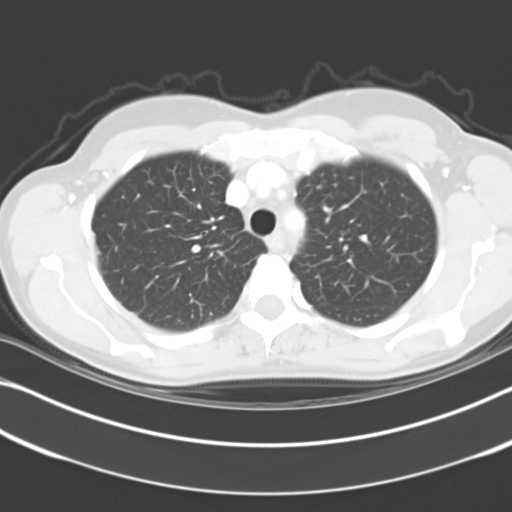
[im 125/133  soft-tissue]
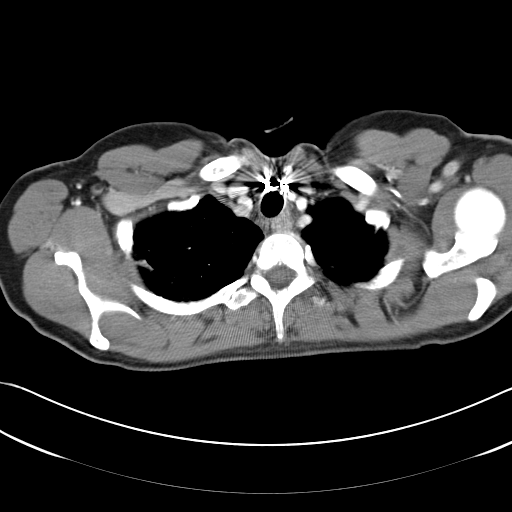
[im 125/133  lung]
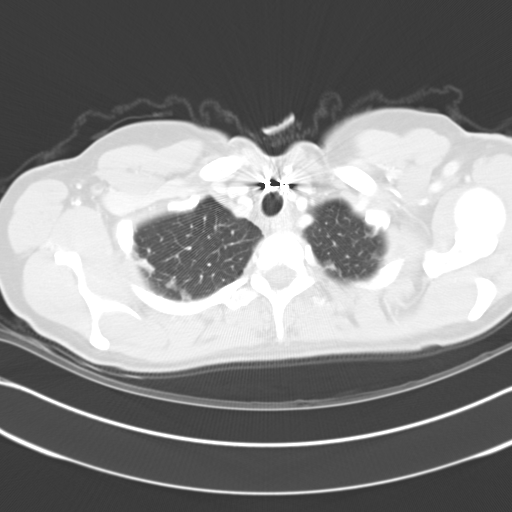

[13 of 32 positions shown; findings below may reference images not displayed]

FINDINGS: Postoperative changes are noted at the thoracic inlet attributed to previous thyroidectomy for reported thyroid cancer.  No breast mass is apparent by CT.  There are no pathologically enlarged lymph nodes in either axilla.  There is no internal mammary, mediastinal or hilar adenopathy.
 Subpleural scarring is present at both lung apices attributed to previous radiation therapy.  No highly suspicious pulmonary nodules are seen.  There are two small subpleural nodules in the right lower lobe on images #36 and #39.  The largest of these measures 4mm in diameter.  There is no pleural or pericardial effusion.
IMPRESSION: 1. No findings highly suspicious for metastatic disease.
 2. There are postsurgical and postradiation changes in the upper chest as described.
 3. Tiny subpleural nodules in the right lower lobe are statistically benign.  Their stability can be assessed on follow-up imaging.
 ABDOMEN CT WITH CONTRAST:
FINDINGS: Subcentimeter low-density lesion along the anterior aspect of the let hepatic lobe on image #54 measures water attenuation and it is probably a small capsular cyst.  There are no suspicious liver lesions.  The spleen, gallbladder, pancreas, adrenal glands and kidneys appear normal.   No enlarged abdominal lymph nodes are seen.  There are no suspicious osseous findings.
IMPRESSION: No evidence of intraabdominal metastatic disease.
 PELVIS CT WITH CONTRAST:
FINDINGS: There is no pelvic mass, fluid collection or inflammatory process.  The uterus is surgically absent.  There are no suspicious osseous lesions.
IMPRESSION: No evidence of pelvic metastatic disease.

## 2008-04-09 ENCOUNTER — Ambulatory Visit: Payer: Self-pay | Admitting: Oncology

## 2008-04-11 LAB — CBC WITH DIFFERENTIAL/PLATELET
Basophils Absolute: 0 10*3/uL (ref 0.0–0.1)
Eosinophils Absolute: 0.1 10*3/uL (ref 0.0–0.5)
HCT: 35 % (ref 34.8–46.6)
HGB: 12.1 g/dL (ref 11.6–15.9)
MCH: 31.1 pg (ref 25.1–34.0)
NEUT#: 2.7 10*3/uL (ref 1.5–6.5)
NEUT%: 53.7 % (ref 38.4–76.8)
RDW: 13 % (ref 11.2–14.5)
lymph#: 1.7 10*3/uL (ref 0.9–3.3)

## 2008-04-11 LAB — COMPREHENSIVE METABOLIC PANEL
Albumin: 4.3 g/dL (ref 3.5–5.2)
BUN: 13 mg/dL (ref 6–23)
CO2: 24 mEq/L (ref 19–32)
Calcium: 9 mg/dL (ref 8.4–10.5)
Chloride: 103 mEq/L (ref 96–112)
Creatinine, Ser: 0.79 mg/dL (ref 0.40–1.20)
Glucose, Bld: 85 mg/dL (ref 70–99)
Potassium: 3.9 mEq/L (ref 3.5–5.3)

## 2008-04-11 LAB — CANCER ANTIGEN 27.29: CA 27.29: 29 U/mL (ref 0–39)

## 2008-08-07 ENCOUNTER — Ambulatory Visit: Payer: Self-pay | Admitting: Oncology

## 2008-08-18 LAB — CBC WITH DIFFERENTIAL/PLATELET
Basophils Absolute: 0 10*3/uL (ref 0.0–0.1)
Eosinophils Absolute: 0.1 10*3/uL (ref 0.0–0.5)
HGB: 12.3 g/dL (ref 11.6–15.9)
LYMPH%: 42.4 % (ref 14.0–49.7)
MCH: 31.4 pg (ref 25.1–34.0)
MCV: 90.6 fL (ref 79.5–101.0)
MONO%: 8.1 % (ref 0.0–14.0)
NEUT#: 2.8 10*3/uL (ref 1.5–6.5)
NEUT%: 46.6 % (ref 38.4–76.8)
Platelets: 287 10*3/uL (ref 145–400)

## 2008-08-18 LAB — COMPREHENSIVE METABOLIC PANEL
Albumin: 4.1 g/dL (ref 3.5–5.2)
Alkaline Phosphatase: 86 U/L (ref 39–117)
BUN: 8 mg/dL (ref 6–23)
Creatinine, Ser: 0.77 mg/dL (ref 0.40–1.20)
Glucose, Bld: 88 mg/dL (ref 70–99)
Total Bilirubin: 0.6 mg/dL (ref 0.3–1.2)

## 2008-08-18 LAB — CANCER ANTIGEN 27.29: CA 27.29: 29 U/mL (ref 0–39)

## 2008-12-25 ENCOUNTER — Encounter: Admission: RE | Admit: 2008-12-25 | Discharge: 2008-12-25 | Payer: Self-pay | Admitting: Oncology

## 2009-02-09 ENCOUNTER — Ambulatory Visit: Payer: Self-pay | Admitting: Oncology

## 2009-02-12 LAB — COMPREHENSIVE METABOLIC PANEL
ALT: 17 U/L (ref 0–35)
AST: 24 U/L (ref 0–37)
Alkaline Phosphatase: 78 U/L (ref 39–117)
Calcium: 9.2 mg/dL (ref 8.4–10.5)
Chloride: 98 mEq/L (ref 96–112)
Creatinine, Ser: 0.81 mg/dL (ref 0.40–1.20)
Total Bilirubin: 0.6 mg/dL (ref 0.3–1.2)

## 2009-02-12 LAB — CBC WITH DIFFERENTIAL/PLATELET
BASO%: 0.8 % (ref 0.0–2.0)
EOS%: 3.1 % (ref 0.0–7.0)
HCT: 36.8 % (ref 34.8–46.6)
MCH: 31.9 pg (ref 25.1–34.0)
MCHC: 34.6 g/dL (ref 31.5–36.0)
MCV: 92.2 fL (ref 79.5–101.0)
MONO%: 8.3 % (ref 0.0–14.0)
NEUT%: 46.1 % (ref 38.4–76.8)
lymph#: 2.1 10*3/uL (ref 0.9–3.3)

## 2009-02-12 LAB — VITAMIN D 25 HYDROXY (VIT D DEFICIENCY, FRACTURES): Vit D, 25-Hydroxy: 39 ng/mL (ref 30–89)

## 2009-07-13 ENCOUNTER — Ambulatory Visit: Payer: Self-pay | Admitting: Oncology

## 2009-07-13 LAB — CBC WITH DIFFERENTIAL/PLATELET
BASO%: 0.7 % (ref 0.0–2.0)
Basophils Absolute: 0 10*3/uL (ref 0.0–0.1)
EOS%: 1.3 % (ref 0.0–7.0)
HCT: 38 % (ref 34.8–46.6)
HGB: 13.1 g/dL (ref 11.6–15.9)
LYMPH%: 35.3 % (ref 14.0–49.7)
MCH: 31.6 pg (ref 25.1–34.0)
MCHC: 34.4 g/dL (ref 31.5–36.0)
MCV: 91.9 fL (ref 79.5–101.0)
MONO%: 8.2 % (ref 0.0–14.0)
NEUT%: 54.5 % (ref 38.4–76.8)
Platelets: 325 10*3/uL (ref 145–400)

## 2009-07-13 LAB — COMPREHENSIVE METABOLIC PANEL
ALT: 15 U/L (ref 0–35)
AST: 19 U/L (ref 0–37)
Alkaline Phosphatase: 64 U/L (ref 39–117)
BUN: 8 mg/dL (ref 6–23)
Calcium: 9.5 mg/dL (ref 8.4–10.5)
Chloride: 99 mEq/L (ref 96–112)
Creatinine, Ser: 0.93 mg/dL (ref 0.40–1.20)
Total Bilirubin: 0.5 mg/dL (ref 0.3–1.2)

## 2009-07-13 LAB — VITAMIN D 25 HYDROXY (VIT D DEFICIENCY, FRACTURES): Vit D, 25-Hydroxy: 82 ng/mL (ref 30–89)

## 2010-01-08 ENCOUNTER — Ambulatory Visit: Payer: Self-pay | Admitting: Oncology

## 2010-01-27 LAB — COMPREHENSIVE METABOLIC PANEL
ALT: 48 U/L — ABNORMAL HIGH (ref 0–35)
AST: 42 U/L — ABNORMAL HIGH (ref 0–37)
Albumin: 4.8 g/dL (ref 3.5–5.2)
Alkaline Phosphatase: 81 U/L (ref 39–117)
BUN: 14 mg/dL (ref 6–23)
CO2: 26 mEq/L (ref 19–32)
Calcium: 9.4 mg/dL (ref 8.4–10.5)
Chloride: 101 mEq/L (ref 96–112)
Creatinine, Ser: 0.99 mg/dL (ref 0.40–1.20)
Glucose, Bld: 93 mg/dL (ref 70–99)
Potassium: 4.1 mEq/L (ref 3.5–5.3)
Sodium: 139 mEq/L (ref 135–145)
Total Bilirubin: 0.5 mg/dL (ref 0.3–1.2)
Total Protein: 6.7 g/dL (ref 6.0–8.3)

## 2010-01-27 LAB — CBC WITH DIFFERENTIAL/PLATELET
BASO%: 0.5 % (ref 0.0–2.0)
Basophils Absolute: 0 10*3/uL (ref 0.0–0.1)
EOS%: 1 % (ref 0.0–7.0)
Eosinophils Absolute: 0 10*3/uL (ref 0.0–0.5)
HCT: 37 % (ref 34.8–46.6)
HGB: 12.5 g/dL (ref 11.6–15.9)
LYMPH%: 37.2 % (ref 14.0–49.7)
MCH: 30.8 pg (ref 25.1–34.0)
MCHC: 33.8 g/dL (ref 31.5–36.0)
MCV: 91 fL (ref 79.5–101.0)
MONO#: 0.3 10*3/uL (ref 0.1–0.9)
MONO%: 7.1 % (ref 0.0–14.0)
NEUT#: 2.6 10*3/uL (ref 1.5–6.5)
NEUT%: 54.2 % (ref 38.4–76.8)
Platelets: 300 10*3/uL (ref 145–400)
RBC: 4.06 10*6/uL (ref 3.70–5.45)
RDW: 12.6 % (ref 11.2–14.5)
WBC: 4.7 10*3/uL (ref 3.9–10.3)
lymph#: 1.8 10*3/uL (ref 0.9–3.3)

## 2010-01-27 LAB — VITAMIN D 25 HYDROXY (VIT D DEFICIENCY, FRACTURES): Vit D, 25-Hydroxy: 73 ng/mL (ref 30–89)

## 2010-01-27 LAB — LACTATE DEHYDROGENASE: LDH: 159 U/L (ref 94–250)

## 2010-01-27 LAB — CANCER ANTIGEN 27.29: CA 27.29: 24 U/mL (ref 0–39)

## 2010-06-01 NOTE — Op Note (Signed)
NAMEJERZY, CROTTEAU                 ACCOUNT NO.:  [id number]   MEDICAL RECORD NO.:  [id number]          PATIENT TYPE:  OIB   LOCATION:  5743                         FACILITY:  [facility]   PHYSICIAN:  Currie Paris, M.D.DATE OF BIRTH:  17-Aug-1960   DATE OF PROCEDURE:  09/06/2006  DATE OF DISCHARGE:  09/07/2006                               OPERATIVE REPORT   CCS [id number].   PREOPERATIVE DIAGNOSIS:  Right breast cancer, status post neoadjuvant  chemotherapy.   POSTOPERATIVE DIAGNOSIS:  Right breast cancer, status post neoadjuvant  chemotherapy.   OPERATION:  Bilateral total mastectomies (skin sparing on the left).   SURGEON:  Currie Paris, M.D.   ASSISTANT:  Velora Heckler, MD   ANESTHESIA:  General endotracheal.   CLINICAL HISTORY:  This is a 50 year old lady who has undergone  neoadjuvant therapy for a somewhat centrally located but upper outer  quadrant right breast cancer.  She underwent a lumpectomy and  fortunately still had fairly extensive disease.  There is also some  question of some genetic abnormality, although BRCA1 and BRCA2 testing  had been negative.  She elected to have a bilateral total mastectomy at  this point in time a with left side being prophylactic.  She has seen  Dr. Pleas Patricia and plans a delayed reconstruction once the rest  of her therapy has been completed.  She still has a port in the left  side.  We decided to leave that in, in case additional chemo as needed  after her radiation.   DESCRIPTION OF PROCEDURE:  The patient seen in the holding area and she  had no further questions.  She noted that appears to have a bit of a  seroma in the axilla and I told her that that would be drained as part  of the surgical procedure.   She was taken to the operating room after satisfactory general (LMA)  anesthesia had been obtained.  Both breasts were prepped and draped as a  sterile field.  I made a right sided elliptical incision going just  a  little bit wider than the nipple-areolar complex and orienting it  transversely but taking very little skin medial to the nipple and only a  little bit more skin lateral to the nipple.  I raised skin flaps in the  usual fashion going superiorly to the clavicle, medially to the sternum  and then out into the axilla where we entered the seroma cavity.  I then  made the inferior flap to the inframammary fold and out laterally to the  latissimus.  The breast was removed from medial to lateral using  cautery.  Bleeders coagulated or tied as indicated.   Once I got into the chronic seroma cavity, I tried to cauterize the  edges of that and then we irrigated.  I made sure everything appeared to  be dry.  I labeled the specimen for orientation purposes.  We placed a  moist pack here.  Attention was turned to the left side.  Here I made a  transverse incision but the only skin I took  was the skin of the nipple-  areolar complex.  We raised flaps in a similar fashion to the right side  and removed the breast and a similar fashion.  Once it was out, we  irrigated and made sure everything was dry.  I put two 19 Blake drains  in and then a temporary pack.  I turned my attention back to the right  side.  There had been no bleeding while we had worked on the left side.  I put two 19 Blake drains in and secured them 2-0 nylons.  I used 3-0  Vicryl to tack the flap down and closed with staples.   Finally I went back to the left side and again there had been no  bleeding here while we were finishing the right side.  Again flaps were  tacked down and staples applied to close the wound.   The patient tolerated procedure well.  There no operative complications.  All counts were correct.      Currie Paris, M.D.  Electronically Signed     CJS/MEDQ  D:  09/06/2006  T:  09/07/2006  Job:  [id number]   cc:   Pierce Crane, M.D.  Consuello Bossier., M.D.

## 2010-06-01 NOTE — Op Note (Signed)
NAMECARMELA, PIECHOWSKI                 ACCOUNT NO.:  [id number]   MEDICAL RECORD NO.:  [id number]          PATIENT TYPE:  AMB   LOCATION:  DSC                          FACILITY:  [facility]   PHYSICIAN:  Currie Paris, M.D.DATE OF BIRTH:  [birth year]   DATE OF PROCEDURE:  02/14/2007  DATE OF DISCHARGE:                               OPERATIVE REPORT   PREOPERATIVE DIAGNOSIS:  Unneeded Port-A-Cath status post chemotherapy  for breast cancer.   POSTOPERATIVE DIAGNOSIS:  Unneeded Port-A-Cath status post chemotherapy  for breast cancer.   OPERATION:  Port-A-Cath removal.   SURGEON:  Dr. Jamey Ripa.   ANESTHESIA:  Local.   CLINICAL HISTORY:  This 46-year lady has finished her chemotherapy and  wishes to have her port removed.   DESCRIPTION OF PROCEDURE:  The patient was seen in the minor procedure  room, and we confirmed the procedure and the location of the Port-A-  Cath.  The area was prepped with some alcohol and anesthetized with  about 10 mL of 1% Xylocaine with epinephrine.  I waited 10 minutes for  good anesthesia, and then we came back, reprepped and draped in a normal  fashion.   The old scar was opened.  The Port-A-Cath tubing was backed out of its  tract with a suture placed to prevent backbleeding.  The port was then  manipulated out of the pocket.  There was no bleeding.   Incision was closed with 3-0 Vicryl, 4-0 Monocryl subcuticular and  Dermabond.   The patient tolerated the procedure well, and there were no  complications.      Currie Paris, M.D.  Electronically Signed     CJS/MEDQ  D:  02/14/2007  T:  02/14/2007  Job:  [id number]

## 2010-06-01 NOTE — Op Note (Signed)
Lisa Guzman, Lisa Guzman                 ACCOUNT NO.:  [id number]   MEDICAL RECORD NO.:  [id number]          PATIENT TYPE:  AMB   LOCATION:  DSC                          FACILITY:  [facility]   PHYSICIAN:  Currie Paris, M.D.DATE OF BIRTH:  1960-07-07   DATE OF PROCEDURE:  06/26/2006  DATE OF DISCHARGE:                               OPERATIVE REPORT   PREOPERATIVE DIAGNOSIS:  Carcinoma of right breast, status post  neoadjuvant chemotherapy.   POSTOPERATIVE DIAGNOSIS:  Carcinoma of right breast, status post  neoadjuvant chemotherapy, with axillary metastases.   PROCEDURE:  1. Right needle-guided lumpectomy.  2. Blue dye injection.  3. Sentinel lymph node biopsy.  4. Full axillary nodal dissection.   SURGEON:  Currie Paris, M.D.   ANESTHESIA:  General.   CLINICAL HISTORY:  Lisa Guzman is a 50 year old lady who presented several  months ago with a fairly large right breast cancer.  Clinically, she had  had an excellent response to her neoadjuvant therapy with the cancer  having shrunk from 3.3 cm down to 0.9 cm and the easily palpable mass  resolving such that there was no palpable abnormality.  We elected to  proceed at this point to a lumpectomy with sentinel node evaluation.  When I had originally seen her, due to the bulk of the tumor, I thought  that she would need central duct breast excision, but because so we had  such a marked clinical response, we elected just to proceed with nipple-  areolar preservation.  She understood that we would need to a node  evaluation and a full node dissection if her sentinel node turned out  positive.   DESCRIPTION OF PROCEDURE:  The patient was seen in the holding area and  she had no further questions.  She had already had her radioactive  isotope injected into the right breast.  We both confirmed that the  right side was the operative side and we both initialed it.  In  addition, she had already had her guidewires placed in the right  breast  and I reviewed those films.  They both entered, one at about the 12  o'clock position and one about the 1:30 position and tracked inferiorly  and slightly laterally.  They appeared that the tips were going to be  beneath the nipple-areolar complex.  Two clips were noted from the prior  biopsies.   The patient was taken to the operating room and after satisfactory  general anesthesia had been obtained, the breast was prepped with some  alcohol and a time-out occurred.  The breast was then injected with 5 mL  of dilute methylene blue subareolarly and this was massaged in.   We then did a full prep and drape.  I elected to make a curvilinear  incision at the superior areolar margin.  I then raised a fairly thin  skin flap superiorly until I could get the guidewires manipulated into  the wound.  I was then able to divide the fatty and breast tissue down  to the chest wall, initially superiorly and then working around  medially.  The medially-placed guidewire was really very superficial and  right under the areola.   Once I had this part done, I then worked a little bit more laterally  from the superior aspect and then I worked underneath the breast tissue  to lift this area all up out of the chest, off the chest wall.  With  that done, I was then able to raise an inferior flap and went out  subareolarly until I was really completely underneath the areola near  the inferior margin of the areola.  I then divided the remaining  attachments of breast tissue.  The more laterally-placed guidewire was  again fairly superficial and the tip really came out of the lateral edge  of the areola.  Once all this was out, I sent this over for specimen  mammography.  I spent several minutes irrigating and making sure  everything was dry, placed some packs, then turned my attention to the  axilla.   Using the Neoprobe, I found a hot area and made a transverse incision  over this.  I divided the  skin and subcutaneous tissues until I entered  the axilla proper.  Using the Neoprobe, I found an area to dissect and  almost immediately found a blue lymphatic leading to a blue lymph node,  which was removed; it had counts of about 95.  The breast itself had  counts of about 800.  Once this was removed, there were no counts above  0-2 in the axilla and I could palpate no obviously abnormal nodes nor  see any blue lymph nodes.  While waiting for the specimen mammogram, I  went ahead and closed the axillary incision.  I infiltrated a little  Marcaine and closed in layers with 3-0 Vicryl and 4-0 Monocryl  subcuticular.   Attention was turned back to the breast.  The radiology reported that  the more laterally placed clip was fairly close to the margins, so I  took what was remaining of a little bit of breast tissue directly under  the areola, but was very, very superficial and there was really no more  breast tissue left here and any further excision would need to involve  removing the nipple-areolar complex.  I again irrigated and tried to  make sure everything was dry and at about this point in time, Dr. Clelia Croft  called to report that the sentinel node was positive.   I replaced some packs into the breast and turned my attention back to  the axilla.  The axillary incision was opened and extended.  I divided  subcu tissues and breast tissue down to the chest wall.  Bleeders were  coagulated or tied.  I identified the pectoralis anteriorly and the  latissimus posteriorly.  I opened the clavipectoral fascia and divided  vessels and the second intercostal nerve using clips.  I preserved the  nerve supply and blood supply to the pectoralis major.  Once I had this  opened up, I could see the axillary vein and I began to sweep the  axillary contents from medial to lateral and superior to inferior.  I identified the long thoracic nerve and stayed away and protected that.  As I worked more  laterally, I divided more small branches coming off of  the axillary vein, staying below the vein.  Again, clips were primarily  used.  I identified the nerve supply and blood supply to the latissimus  and was able to preserve that and strip away the  intervening tissue.  Then a little bit more laterally-placed tissue up in the axilla was  divided and the final attachments of the axillary contents to the  anterior edge of the latissimus dorsi muscle were divided and removed.  I checked to make sure both the long thoracic and thoracodorsal nerves  worked and they both did.  I infiltrated a little Marcaine into the area  around the nerves to see if that would help with postop analgesia.  I  made sure everything was dry and placed a pack.   I turned my attention back to the breast incision and it had remained  completely dry while I was working on the axillary dissection.  I then  closed this in layers with a little bit of a subcu 3-0 Vicryl followed  by 4-0 Monocryl and finally Dermabond.   After I closed that, I went back and rechecked the axillary incision and  it again had remained dry.  I had already placed a 19 Blake drain at the  end of dissection and that I closed this incision was 3-0 Vicryl and  because of a little bit of perhaps tension, I was concerned about it and  I put staples in instead of Monocryl.   The patient tolerated the procedure well and there were no operative  complications.  All counts were correct.  Estimated blood loss was less  100 mL and probably closer to about 20.      Currie Paris, M.D.  Electronically Signed     CJS/MEDQ  D:  06/26/2006  T:  06/27/2006  Job:  [id number]   cc:   Pierce Crane, M.D.  [facility] MD

## 2010-06-04 NOTE — Op Note (Signed)
Lisa Guzman, Lisa Guzman NO.:  [id number]   MEDICAL RECORD NO.:  [id number]          PATIENT TYPE:  OIB   LOCATION:  3012                         FACILITY:  [facility]   PHYSICIAN:  Donalee Citrin, M.D.        DATE OF BIRTH:  December 25, 1960   DATE OF PROCEDURE:  03/07/2005  DATE OF DISCHARGE:                                 OPERATIVE REPORT   PREOPERATIVE DIAGNOSIS:  1.  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  2.  Spinal stenosis L4-5, right.   PROCEDURE:  Lumbar laminectomy and microdiskectomy L4-5, right with  microscopic dissection of right L5 nerve root and microscopic diskectomy.   SURGEON:  Donalee Citrin, M.D.   ASSISTANT:  Reinaldo Meeker, M.D.   ANESTHESIA:  General endotracheal.   HPI:  The patient is a very pleasant 50 year old female who has had  longstanding back and right leg pain radiating down to the front of her shin  and occasionally also on the top of her foot and big toe with weakness of  her EHL.  __________ shows severe lumbar spinal stenosis with a large  ruptured disk at L4-5 on the right.  Due to the patient's preoperative  weakness, with very conservative treatment, the patient was recommended  laminectomy and microdiskectomy.  The risks and benefits of the operation  were explained and she understands and agreed to proceed forward.  The  patient was brought into the OR, was induced under general anesthesia, was  positioned prone on the Wilson frame, back was prepped and draped in routine  sterile fashion.  Preop x-ray localized the L4-5 disk space.  A midline  incision was made just after infiltration of 10 mL of lidocaine with  epinephrine and Bovie electrocautery was used to take down subcutaneous  tissues.  Subperiosteal dissection was carried out lamina of L4 and L5 on  the right.  Intraoperative x-ray confirmed mobilization at the L4-5 level.  Then the inferior aspect of the lamina of L4 medial facet  complex and  superior aspect of the lamina of L5 was drilled down with a high speed  drill.  Then, using a 2 and 3 mm Kerrison punch, the inferior aspect of the  lamina of L4 and medial facet was removed, exposing the ligamentum flavum.  The ligament was noted to be markedly hypertrophied and compressing the  thecal sac in an hourglass format.  So, using a 4 Penfield, it was dissected  off the dura.  The dura was noted to be markedly redundant.  The Kerrison  was then used to remove the ligament in piecemeal fashion.  At this point,  the operating microscope was draped, brought onto the field.  Under  microscopic illumination, the remainder of the ligament was dissected off  the lateral dura and removed in piecemeal fashion with 2 and 3 mm Kerrison  punch.  This exposed the proximal L5 nerve root.  This was noted to be  markedly stenotic and redundant, being compressed by a large disk rupture  still contained within  the ligament at the interspace.  So, the 4-Penfield  was used to dissect the 5 nerve off the pedicle and the disk space reflected  medially with D'Errico.  An 11-blade scalpel was used to make the  annulotomy.  Then several large fragments of disk were removed from the disk  space, in the lateral compartment as well as the central compartment, using  a downgoing Epstein curet.  Several more fragments were removed and teased  away from the undersurface of the dura from the central compartment and  removed with upgoing pituitaries.  At the end of the diskectomy, there was  no further _____ fiber, the thecal sac was explored with a hockey stick and  coronary dilator noted to be widely patent.  Then the wound was copiously  irrigated and meticulous hemostasis was maintained.  Gelfoam was laid over  the top of dura.  The muscle and fascia were reapproximated in layers with  interrupted Vicryl.  The skin was closed with running #4-0 subcuticular.  Benzoin and Steri-Strips were  applied.  The patient went to the recovery  room in stable condition.  At the end of the case, all needle counts and  sponge correct.           ______________________________  Donalee Citrin, M.D.     GC/MEDQ  D:  03/07/2005  T:  03/07/2005  Job:  [id number]

## 2010-06-04 NOTE — Op Note (Signed)
Lisa Guzman, Lisa Guzman                 ACCOUNT NO.:  [id number]   MEDICAL RECORD NO.:  [id number]          PATIENT TYPE:  AMB   LOCATION:  SDS                          FACILITY:  [facility]   PHYSICIAN:  Currie Paris, M.D.DATE OF BIRTH:  [birth year]   DATE OF PROCEDURE:  12/14/2005  DATE OF DISCHARGE:                               OPERATIVE REPORT   CCS [id number].   PREOPERATIVE DIAGNOSIS:  Right breast cancer.   POSTOPERATIVE DIAGNOSIS:  Right breast cancer.   OPERATION:  Core biopsy right breast (four cores), placement of Port-A-  Cath.   SURGEON:  Currie Paris, M.D.   ANESTHESIA:  MAC.   CLINICAL HISTORY:  Ms. Linson is a 45-year lady who is going to have  chemotherapy neoadjuvant for her right breast cancer.  She is  participating in the B-40 study and four core biopsies were needed for  this.  She in addition needs a Port-A-Cath for the chemotherapy.   DESCRIPTION OF PROCEDURE:  The patient was seen in the preoperative area  and had no further questions.  We identified and marked the right side  as the side for the biopsy.  I reviewed Port-A-Cath placement with risks  and complications.  We also talked about the fact that she was  participating in a study and that was the only reason that the biopsies  were being done.  She does have a palpable mass with ecchymosis noted  from her prior biopsies.   DESCRIPTION OF PROCEDURE:  The patient was then taken to the operating  room and after satisfactory IV sedation the right breast was prepped  with some Betadine.  The time-out occurred.  I infiltrated 1% Xylocaine  and used the old core biopsy site and obtained four what appeared to be  good cores with whitish thick tissue consistent with tumor.  These were  transferred to the research nurse.   Everything appeared to be dry and the small incision was closed with  Dermabond.   At this point the entire upper chest and lower neck were prepped and  draped as a single  sterile field placed.  The patient placed in  Trendelenburg.   1% Xylocaine was infiltrated in the left infraclavicular fossa.  Using  the catheter and guidewire I was able to access the left subclavian vein  on initial attempt and the guidewire threaded into superior vena cavae  easily with position confirmed with fluoro.   Additional local was infiltrated over the anterior chest wall and a  transverse incision made and subcutaneous pocket fashioned.  This was  all done with cautery.  Additional local was infiltrated from here to  the guidewire entry site.  The Port-A-Cath tubing was then pulled  through there so that we had it in the tunnel.  It was flushed.   The tract was dilated once and the dilator and guidewire were removed.  The catheter was threaded to approximately 22 cm and the peel-away  sheath removed.  It aspirated irrigated easily.   Using fluoro I backed the catheter up from its position in the right  atrium into what appeared to be the distal superior vena cava just above  the cavoatrial junction.  Again it aspirated and irrigated easily.   The reservoir was flushed with dilute heparin and the catheter attached  and locking mechanism engaged.  This aspirated and irrigated easily.  The reservoir was placed in its pocket and secured with 3-0 Vicryl  sutures.  Using fluoro we appeared to have good positioning and no  kinks.   The incision was closed with 3-0 Vicryl and 4-0 Monocryl subcuticular  plus some Dermabond.  The reservoir was then accessed and aspirated,  flushed with dilute heparin followed by concentrated aqueous heparin.  Sterile dressings were then applied.   The patient tolerated procedure well.  There were no operative  complications.  All counts were correct.      Currie Paris, M.D.  Electronically Signed     CJS/MEDQ  D:  12/14/2005  T:  12/14/2005  Job:  [id number]   cc:   Teena Irani. Arlyce Dice, M.D.  Pierce Crane, M.D.

## 2010-08-02 ENCOUNTER — Other Ambulatory Visit: Payer: Self-pay | Admitting: Oncology

## 2010-08-02 ENCOUNTER — Encounter (HOSPITAL_BASED_OUTPATIENT_CLINIC_OR_DEPARTMENT_OTHER): Payer: BC Managed Care – PPO | Admitting: Oncology

## 2010-08-02 DIAGNOSIS — C50419 Malignant neoplasm of upper-outer quadrant of unspecified female breast: Secondary | ICD-10-CM

## 2010-08-02 LAB — CBC WITH DIFFERENTIAL/PLATELET
BASO%: 0.8 % (ref 0.0–2.0)
EOS%: 1.6 % (ref 0.0–7.0)
LYMPH%: 39.2 % (ref 14.0–49.7)
MCH: 31.1 pg (ref 25.1–34.0)
MCHC: 35.1 g/dL (ref 31.5–36.0)
MCV: 88.7 fL (ref 79.5–101.0)
MONO%: 8.4 % (ref 0.0–14.0)
NEUT%: 50 % (ref 38.4–76.8)
Platelets: 288 10*3/uL (ref 145–400)
RBC: 4.2 10*6/uL (ref 3.70–5.45)
WBC: 4.7 10*3/uL (ref 3.9–10.3)

## 2010-08-02 LAB — COMPREHENSIVE METABOLIC PANEL
AST: 23 U/L (ref 0–37)
Alkaline Phosphatase: 82 U/L (ref 39–117)
BUN: 13 mg/dL (ref 6–23)
Glucose, Bld: 119 mg/dL — ABNORMAL HIGH (ref 70–99)
Sodium: 138 mEq/L (ref 135–145)
Total Bilirubin: 0.4 mg/dL (ref 0.3–1.2)
Total Protein: 7.2 g/dL (ref 6.0–8.3)

## 2010-08-16 ENCOUNTER — Encounter (HOSPITAL_BASED_OUTPATIENT_CLINIC_OR_DEPARTMENT_OTHER): Payer: BC Managed Care – PPO | Admitting: Oncology

## 2010-08-16 ENCOUNTER — Other Ambulatory Visit: Payer: Self-pay | Admitting: Oncology

## 2010-08-16 DIAGNOSIS — C50919 Malignant neoplasm of unspecified site of unspecified female breast: Secondary | ICD-10-CM

## 2010-08-16 DIAGNOSIS — C50419 Malignant neoplasm of upper-outer quadrant of unspecified female breast: Secondary | ICD-10-CM

## 2010-10-29 LAB — DIFFERENTIAL
Basophils Absolute: 0
Basophils Relative: 1
Eosinophils Absolute: 0.2
Eosinophils Relative: 3
Lymphocytes Relative: 30
Lymphs Abs: 1.9
Monocytes Absolute: 0.4
Monocytes Relative: 7
Neutro Abs: 3.7
Neutrophils Relative %: 60

## 2010-10-29 LAB — BASIC METABOLIC PANEL
BUN: 17
Creatinine, Ser: 0.77
GFR calc non Af Amer: >60
Glucose, Bld: 92
Potassium: 4.2

## 2010-10-29 LAB — CBC
HCT: 38.5
MCV: 90.2
Platelets: 305
RDW: 13.3
WBC: 6.2

## 2010-11-04 LAB — BASIC METABOLIC PANEL
BUN: 16
CO2: 27
Calcium: 9.2
Chloride: 106
Creatinine, Ser: 0.86
GFR calc Af Amer: >60
GFR calc non Af Amer: >60
Glucose, Bld: 115 — ABNORMAL HIGH
Potassium: 4.4
Sodium: 139

## 2010-11-04 LAB — CBC
HCT: 34.1 — ABNORMAL LOW
Hemoglobin: 11.6 — ABNORMAL LOW
MCHC: 34.1
RBC: 3.66 — ABNORMAL LOW
RDW: 16.1 — ABNORMAL HIGH

## 2010-11-04 LAB — DIFFERENTIAL
Basophils Absolute: 0
Basophils Relative: 1
Eosinophils Relative: 3
Monocytes Absolute: 0.6
Monocytes Relative: 17 — ABNORMAL HIGH
Neutro Abs: 1.6 — ABNORMAL LOW

## 2010-11-04 LAB — PROTIME-INR
INR: 1
Prothrombin Time: 13

## 2010-12-28 ENCOUNTER — Ambulatory Visit
Admission: RE | Admit: 2010-12-28 | Discharge: 2010-12-28 | Disposition: A | Discharge status: Home / Self Care | Payer: BC Managed Care – PPO | Source: Ambulatory Visit | Attending: Oncology | Admitting: Oncology

## 2010-12-28 DIAGNOSIS — Z780 Asymptomatic menopausal state: Secondary | ICD-10-CM

## 2010-12-28 DIAGNOSIS — C50919 Malignant neoplasm of unspecified site of unspecified female breast: Secondary | ICD-10-CM

## 2011-01-22 ENCOUNTER — Telehealth: Payer: Self-pay | Admitting: Oncology

## 2011-01-22 NOTE — Telephone Encounter (Signed)
called pts home lm with husband to rtn call to schedule [id number]

## 2011-02-15 ENCOUNTER — Other Ambulatory Visit: Payer: Self-pay | Admitting: *Deleted

## 2011-02-15 DIAGNOSIS — C50919 Malignant neoplasm of unspecified site of unspecified female breast: Secondary | ICD-10-CM

## 2011-02-15 DIAGNOSIS — E559 Vitamin D deficiency, unspecified: Secondary | ICD-10-CM

## 2011-02-16 ENCOUNTER — Encounter: Payer: Self-pay | Admitting: Oncology

## 2011-02-16 ENCOUNTER — Ambulatory Visit (HOSPITAL_BASED_OUTPATIENT_CLINIC_OR_DEPARTMENT_OTHER): Payer: BC Managed Care – PPO | Admitting: Oncology

## 2011-02-16 ENCOUNTER — Other Ambulatory Visit (HOSPITAL_BASED_OUTPATIENT_CLINIC_OR_DEPARTMENT_OTHER): Payer: BC Managed Care – PPO | Admitting: Lab

## 2011-02-16 VITALS — BP 130/69 | HR 74 | Temp 98.3°F | Wt 147.6 lb

## 2011-02-16 DIAGNOSIS — Z85850 Personal history of malignant neoplasm of thyroid: Secondary | ICD-10-CM | POA: Insufficient documentation

## 2011-02-16 DIAGNOSIS — E559 Vitamin D deficiency, unspecified: Secondary | ICD-10-CM

## 2011-02-16 DIAGNOSIS — C50919 Malignant neoplasm of unspecified site of unspecified female breast: Secondary | ICD-10-CM

## 2011-02-16 DIAGNOSIS — C50911 Malignant neoplasm of unspecified site of right female breast: Secondary | ICD-10-CM

## 2011-02-16 DIAGNOSIS — N719 Inflammatory disease of uterus, unspecified: Secondary | ICD-10-CM

## 2011-02-16 HISTORY — DX: Malignant neoplasm of unspecified site of right female breast: C50.911

## 2011-02-16 HISTORY — DX: Personal history of malignant neoplasm of thyroid: Z85.850

## 2011-02-16 LAB — CBC WITH DIFFERENTIAL/PLATELET
Eosinophils Absolute: 0.2 10*3/uL (ref 0.0–0.5)
HCT: 36.3 % (ref 34.8–46.6)
LYMPH%: 38.7 % (ref 14.0–49.7)
MCHC: 34.2 g/dL (ref 31.5–36.0)
MCV: 90.1 fL (ref 79.5–101.0)
MONO#: 0.4 10*3/uL (ref 0.1–0.9)
MONO%: 8.5 % (ref 0.0–14.0)
NEUT#: 2.3 10*3/uL (ref 1.5–6.5)
NEUT%: 49.1 % (ref 38.4–76.8)
Platelets: 253 10*3/uL (ref 145–400)
WBC: 4.6 10*3/uL (ref 3.9–10.3)

## 2011-02-16 LAB — COMPREHENSIVE METABOLIC PANEL
Alkaline Phosphatase: 76 U/L (ref 39–117)
BUN: 11 mg/dL (ref 6–23)
CO2: 27 mEq/L (ref 19–32)
Creatinine, Ser: 0.89 mg/dL (ref 0.50–1.10)
Glucose, Bld: 109 mg/dL — ABNORMAL HIGH (ref 70–99)
Total Bilirubin: 0.3 mg/dL (ref 0.3–1.2)

## 2011-02-16 LAB — CANCER ANTIGEN 27.29: CA 27.29: 23 U/mL (ref 0–39)

## 2011-02-16 LAB — VITAMIN D 25 HYDROXY (VIT D DEFICIENCY, FRACTURES): Vit D, 25-Hydroxy: 37 ng/mL (ref 30–89)

## 2011-02-16 NOTE — Progress Notes (Signed)
Hematology and Oncology Follow Up Visit  Lisa Guzman [id number] [birth year] 51 y.o. 02/16/2011    HPI:    Patient is a 51 year old [place] woman with a history of a clinical T2, locally advanced right breast carcinoma noted to be ER/PR positive HER-2 negative, treated neo-adjuvantly according to NSABP B-40 protocol for which she received 4 cycles of Taxotere/Gemzar given in combination with Avastin followed by 4 cycles of Adriamycin/Cytoxan. She underwent bilateral mastectomies with breast reconstruction Brief course of Arimidex, followed by Femara, currently on tamoxifen 20 mg by mouth daily since her last office visit in July 2012.   Interim History:      [name] is seen today for followup pertaining to her history of a locally advanced ER/PR positive HER-2 negative right breast carcinoma. She continues on tamoxifen 20 mg by mouth daily which she is tolerating quite well overall.  She still has episodes of vaginal dryness despite use of appropriate lubricants. She denies any hot flashes. She denies any palpable venous cords. No unexplained fevers chills night sweats shortness of breath chest pain no nausea emesis diarrhea or constipation issues. 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 Patient had a full body bone density performed on 12/31/2010 which showed normal bone density.  A detailed review of systems is otherwise noncontributory as noted below.  Review of Systems: Constitutional:  no weight loss, fever, night sweats and feels well Eyes: No complaints ENT: No complaints Cardiovascular: no chest pain or dyspnea on exertion Respiratory: no cough, shortness of breath, or wheezing Neurological: no TIA or stroke symptoms Dermatological: negative Gastrointestinal: no abdominal pain, change in bowel habits, or black or bloody stools Genito-Urinary: positive for - dysuria Hematological and Lymphatic: negative Breast:  negative Musculoskeletal: negative Remaining ROS negative.   Medications:   I have reviewed the patient's current medications.  Current Outpatient Prescriptions  Medication Sig Dispense Refill  . buPROPion (WELLBUTRIN XL) 300 MG 24 hr tablet Take 300 mg by mouth daily.      Marland Kitchen levothyroxine (SYNTHROID, LEVOTHROID) 175 MCG tablet Take 175 mcg by mouth daily.      . rosuvastatin (CRESTOR) 10 MG tablet Take 10 mg by mouth daily.      . tamoxifen (NOLVADEX) 20 MG tablet Take 20 mg by mouth daily.        Allergies: No Known Allergies  Physical Exam: Filed Vitals:   02/16/11 1146  BP: 130/69  Pulse: 74  Temp: 98.3 F (36.8 C)   HEENT:  Sclerae anicteric, conjunctivae pink.  Oropharynx clear.  No mucositis or candidiasis.   Nodes:  No cervical, supraclavicular, or axillary lymphadenopathy palpated.  Breast Exam: Bilateral breast reconstruction sites note showed no evidence of skin nodules or masses. The right axilla is clear of any palpable lymphadenopathy.   Lungs:  Clear to auscultation bilaterally.  No crackles, rhonchi, or wheezes.   Heart:  Regular rate and rhythm.   Abdomen:  Soft, nontender.  Positive bowel sounds.  No organomegaly or masses palpated.   Musculoskeletal:  No focal spinal tenderness to palpation.  Extremities:  Benign.  No peripheral edema or cyanosis.   Skin:  Benign.   Neuro:  Nonfocal, alert and oriented x 3.   Lab Results: Lab Results  Component Value Date   WBC 4.6 02/16/2011   HGB 12.4 02/16/2011   HCT 36.3 02/16/2011   MCV 90.1 02/16/2011   PLT 253 02/16/2011   NEUTROABS 2.3 02/16/2011  Chemistry      Component Value Date/Time   NA 134* 02/16/2011 1127   K 3.8 02/16/2011 1127   CL 103 02/16/2011 1127   CO2 27 02/16/2011 1127   BUN 11 02/16/2011 1127   CREATININE 0.89 02/16/2011 1127      Component Value Date/Time   CALCIUM 8.9 02/16/2011 1127   ALKPHOS 76 02/16/2011 1127   AST 21 02/16/2011 1127   ALT 15 02/16/2011 1127   BILITOT 0.3 02/16/2011  1127        Radiological Studies: 12/28/10 Clinical Data: 51 year old post menopausal female currently taking  1000 international units vitamin D per day but no reported  supplemental calcium. History of breast cancer.  DUAL X-RAY ABSORPTIOMETRY (DXA) FOR BONE MINERAL DENSITY  AP LUMBAR SPINE L1-L4  Bone Mineral Density (BMD): 1.001 g/cm2  Young Adult T Score: -0.4  Z Score: 0.4  LEFT FEMUR NECK  Bone Mineral Density (BMD): 0.748 g/cm2  Young Adult T Score: -0.9  Z Score: 0.0  ASSESSMENT: Patient's diagnostic category is NORMAL by WHO  Criteria.  FRACTURE RISK: NOT INCREASED  FRAX: World Health Organization FRAX assessment of absolute  fracture risk is not calculated for this patient because the  patient has normal bone mineral density  Comparison: 12/25/2008, 12/25/2006. At the lumbar spine, there has  been statistically significant 9.8% bone mineral density loss over  the baseline exam and 5.2% loss over the prior exam. At the left  total femur, there has been statistically significant 5.2% loss in  bone mineral density over the baseline exam, but no statistically  significant change since the prior exam.     Assessment:      Patient is a 51 year old [place] woman with a history of a clinical T2, locally advanced right breast carcinoma noted to be ER/PR positive HER-2 negative, treated neo-adjuvantly according to NSABP B-40 protocol for which she received 4 cycles of Taxotere/Gemzar given in combination with Avastin followed by 4 cycles of Adriamycin/Cytoxan. She underwent bilateral mastectomies with breast reconstruction Brief course of Arimidex, followed by Femara, currently on tamoxifen 20 mg by mouth daily since her last office visit in July 2012, with overall excellent tolerance but continued issues with vaginal dryness.   Case reviewed with Dr. Pierce Crane.  Plan:     At this point, [name] will continue on her tamoxifen 20 mg by mouth daily until  her next office visit which is scheduled in July of 2013. At that point in time, he may be decided that she comes off tamoxifen since checked at that point she will have completed 5 years of adjuvant hormonal therapy. We may also consider referring her to the Survivorship clinic. She is aware of her normal bone density both continue on her calcium and vitamin D supplementation along with weightbearing exercise. She is to contact us prior to her six-month followup if indicated rise.   This plan was reviewed with the patient, who voices understanding and agreement.  She knows to call with any changes or problems.    Carolynne Schuchard T, PA-C 02/16/2011

## 2011-07-19 ENCOUNTER — Other Ambulatory Visit: Payer: BC Managed Care – PPO | Admitting: Lab

## 2011-07-19 ENCOUNTER — Ambulatory Visit: Payer: BC Managed Care – PPO | Admitting: Oncology

## 2011-07-19 NOTE — Progress Notes (Signed)
[facility] today.  Letter mailed to patient.  

## 2011-08-09 ENCOUNTER — Other Ambulatory Visit: Payer: BC Managed Care – PPO | Admitting: Lab

## 2011-08-09 ENCOUNTER — Ambulatory Visit: Payer: BC Managed Care – PPO | Admitting: Oncology

## 2011-08-16 ENCOUNTER — Other Ambulatory Visit: Payer: Self-pay | Admitting: Oncology

## 2011-08-16 DIAGNOSIS — C50911 Malignant neoplasm of unspecified site of right female breast: Secondary | ICD-10-CM

## 2011-10-07 ENCOUNTER — Telehealth: Payer: Self-pay | Admitting: *Deleted

## 2011-10-07 NOTE — Telephone Encounter (Signed)
Patient confirmed over the phone the new date and time on 11-11-2011 at 3:00pm

## 2011-10-12 ENCOUNTER — Other Ambulatory Visit: Payer: BC Managed Care – PPO | Admitting: Lab

## 2011-10-12 ENCOUNTER — Ambulatory Visit: Payer: BC Managed Care – PPO | Admitting: Family

## 2011-11-11 ENCOUNTER — Telehealth: Payer: Self-pay | Admitting: *Deleted

## 2011-11-11 ENCOUNTER — Ambulatory Visit (HOSPITAL_BASED_OUTPATIENT_CLINIC_OR_DEPARTMENT_OTHER): Payer: BC Managed Care – PPO | Admitting: Oncology

## 2011-11-11 ENCOUNTER — Other Ambulatory Visit (HOSPITAL_BASED_OUTPATIENT_CLINIC_OR_DEPARTMENT_OTHER): Payer: BC Managed Care – PPO | Admitting: Lab

## 2011-11-11 VITALS — BP 156/74 | HR 76 | Temp 98.4°F | Resp 20 | Ht 68.0 in | Wt 151.0 lb

## 2011-11-11 DIAGNOSIS — C50419 Malignant neoplasm of upper-outer quadrant of unspecified female breast: Secondary | ICD-10-CM

## 2011-11-11 DIAGNOSIS — Z170 Estrogen receptor positive status [ER+]: Secondary | ICD-10-CM

## 2011-11-11 DIAGNOSIS — C50911 Malignant neoplasm of unspecified site of right female breast: Secondary | ICD-10-CM

## 2011-11-11 LAB — CBC WITH DIFFERENTIAL/PLATELET
Basophils Absolute: 0 10*3/uL (ref 0.0–0.1)
EOS%: 1.9 % (ref 0.0–7.0)
Eosinophils Absolute: 0.1 10*3/uL (ref 0.0–0.5)
HGB: 13.1 g/dL (ref 11.6–15.9)
MCH: 31.4 pg (ref 25.1–34.0)
MONO#: 0.6 10*3/uL (ref 0.1–0.9)
NEUT#: 2.6 10*3/uL (ref 1.5–6.5)
RDW: 12.5 % (ref 11.2–14.5)
WBC: 5.8 10*3/uL (ref 3.9–10.3)
lymph#: 2.4 10*3/uL (ref 0.9–3.3)

## 2011-11-11 LAB — COMPREHENSIVE METABOLIC PANEL (CC13)
ALT: 24 U/L (ref 0–55)
CO2: 26 mEq/L (ref 22–29)
Potassium: 3.9 mEq/L (ref 3.5–5.1)
Sodium: 138 mEq/L (ref 136–145)
Total Bilirubin: 0.4 mg/dL (ref 0.20–1.20)
Total Protein: 6.7 g/dL (ref 6.4–8.3)

## 2011-11-11 LAB — LACTATE DEHYDROGENASE (CC13): LDH: 181 U/L (ref 125–220)

## 2011-11-11 NOTE — Telephone Encounter (Signed)
Gave patient appointment for 2014 

## 2011-11-11 NOTE — Progress Notes (Signed)
Hematology and Oncology Follow Up Visit  Lisa Guzman [id number] 05-Apr-1960 51 y.o. 11/11/2011    HPI:    Patient is a 51 year old [place] Guzman woman with a history of a clinical T2, locally advanced right breast carcinoma noted to be ER/PR positive HER-2 negative, treated neo-adjuvantly according to NSABP B-40 protocol for which she received 4 cycles of Taxotere/Gemzar given in combination with Avastin followed by 4 cycles of Adriamycin/Cytoxan. She underwent bilateral mastectomies with breast reconstruction Brief course of Arimidex, followed by Femara, currently on tamoxifen 20 mg by mouth daily.  Interim History:      Lisa Guzman is seen today for followup pertaining to her history of a locally advanced ER/PR positive HER-2 negative right breast carcinoma. She continues on tamoxifen 20 mg by mouth daily which she is tolerating quite well overall. She is doing well. She is well-appearing in the hospital. She is on tamoxifen. She's had no other complaints. Overall performance status is excellent. A detailed review of systems is otherwise noncontributory as noted below.  Review of Systems: Constitutional:  no weight loss, fever, night sweats and feels well Eyes: No complaints ENT: No complaints Cardiovascular: no chest pain or dyspnea on exertion Respiratory: no cough, shortness of breath, or wheezing Neurological: no TIA or stroke symptoms Dermatological: negative Gastrointestinal: no abdominal pain, change in bowel habits, or black or bloody stools Genito-Urinary: positive for - dysuria Hematological and Lymphatic: negative Breast: negative Musculoskeletal: negative Remaining ROS negative.   Medications:   I have reviewed the patient's current medications.  Current Outpatient Prescriptions  Medication Sig Dispense Refill  . ALPRAZolam (XANAX) 0.25 MG tablet Take 0.25 mg by mouth at bedtime as needed.      Marland Kitchen buPROPion (WELLBUTRIN XL) 300 MG 24 hr tablet Take 300 mg by  mouth daily.      Marland Kitchen levothyroxine (SYNTHROID, LEVOTHROID) 175 MCG tablet Take 175 mcg by mouth daily.      . RELPAX 40 MG tablet One tablet by mouth at onset of headache. May repeat in 2 hours if headache persists or recurs.      . rosuvastatin (CRESTOR) 10 MG tablet Take 10 mg by mouth daily.      . tamoxifen (NOLVADEX) 20 MG tablet TAKE 1 TABLET EVERY DAY  30 tablet  2    Allergies: No Known Allergies  Physical Exam: Filed Vitals:   11/11/11 1529  BP: 156/74  Pulse: 76  Temp: 98.4 F (36.9 C)  Resp: 20   HEENT:  Sclerae anicteric, conjunctivae pink.  Oropharynx clear.  No mucositis or candidiasis.   Nodes:  No cervical, supraclavicular, or axillary lymphadenopathy palpated.  Breast Exam: Bilateral breast reconstruction sites note showed no evidence of skin nodules or masses. The right axilla is clear of any palpable lymphadenopathy.   Lungs:  Clear to auscultation bilaterally.  No crackles, rhonchi, or wheezes.   Heart:  Regular rate and rhythm.   Abdomen:  Soft, nontender.  Positive bowel sounds.  No organomegaly or masses palpated.   Musculoskeletal:  No focal spinal tenderness to palpation.  Extremities:  Benign.  No peripheral edema or cyanosis.   Skin:  Benign.   Neuro:  Nonfocal, alert and oriented x 3.   Lab Results: Lab Results  Component Value Date   WBC 5.8 11/11/2011   HGB 13.1 11/11/2011   HCT 37.6 11/11/2011   MCV 90.2 11/11/2011   PLT 242 11/11/2011   NEUTROABS 2.6 11/11/2011     Chemistry      Component  Value Date/Time   NA 134* 02/16/2011 1127   K 3.8 02/16/2011 1127   CL 103 02/16/2011 1127   CO2 27 02/16/2011 1127   BUN 11 02/16/2011 1127   CREATININE 0.89 02/16/2011 1127      Component Value Date/Time   CALCIUM 8.9 02/16/2011 1127   ALKPHOS 76 02/16/2011 1127   AST 21 02/16/2011 1127   ALT 15 02/16/2011 1127   BILITOT 0.3 02/16/2011 1127        Radiological Studies: 12/28/10 Clinical Data: 51 year old post menopausal female currently taking    1000 international units vitamin D per day but no reported  supplemental calcium. History of breast cancer.  DUAL X-RAY ABSORPTIOMETRY (DXA) FOR BONE MINERAL DENSITY  AP LUMBAR SPINE L1-L4  Bone Mineral Density (BMD): 1.001 g/cm2  Young Adult T Score: -0.4  Z Score: 0.4  LEFT FEMUR NECK  Bone Mineral Density (BMD): 0.748 g/cm2  Young Adult T Score: -0.9  Z Score: 0.0  ASSESSMENT: Patient's diagnostic category is NORMAL by WHO  Criteria.  FRACTURE RISK: NOT INCREASED  FRAX: World Health Organization FRAX assessment of absolute  fracture risk is not calculated for this patient because the  patient has normal bone mineral density  Comparison: 12/25/2008, 12/25/2006. At the lumbar spine, there has  been statistically significant 9.8% bone mineral density loss over  the baseline exam and 5.2% loss over the prior exam. At the left  total femur, there has been statistically significant 5.2% loss in  bone mineral density over the baseline exam, but no statistically  significant change since the prior exam.     Assessment:      Patient is a 51 year old [place] woman with a history of a clinical T2, locally advanced right breast carcinoma noted to be ER/PR positive HER-2 negative, treated neo-adjuvantly according to NSABP B-40 protocol for which she received 4 cycles of Taxotere/Gemzar given in combination with Avastin followed by 4 cycles of Adriamycin/Cytoxan. She underwent bilateral mastectomies with breast reconstruction Brief course of Arimidex, followed by Femara, currently on tamoxifen 20 mg by mouth daily since her last office visit in July 2012, with overall excellent tolerance but continued issues with vaginal dryness.     Plan:  Patient is doing well. No clinical evidence of recurrence. I will see her back in a years time. She is now over 5 years from diagnosis. She is on tamoxifen. We discussed continuation of tamoxifen given the data that shows 10 years of  tamoxifen superior than 5 years. She has been on a number of AI meds as well.    I will continue to see her on an annual basis.    Lisa Guzman,PETERmd  11/11/2011

## 2011-11-12 LAB — VITAMIN D 25 HYDROXY (VIT D DEFICIENCY, FRACTURES): Vit D, 25-Hydroxy: 43 ng/mL (ref 30–89)

## 2011-11-14 ENCOUNTER — Other Ambulatory Visit: Payer: Self-pay | Admitting: Oncology

## 2011-11-14 DIAGNOSIS — C50911 Malignant neoplasm of unspecified site of right female breast: Secondary | ICD-10-CM

## 2012-01-01 ENCOUNTER — Encounter (HOSPITAL_COMMUNITY): Payer: Self-pay | Admitting: Emergency Medicine

## 2012-01-01 ENCOUNTER — Emergency Department (HOSPITAL_COMMUNITY)
Admission: EM | Admit: 2012-01-01 | Discharge: 2012-01-01 | Disposition: A | Discharge status: Home / Self Care | Payer: BC Managed Care – PPO | Attending: Emergency Medicine | Admitting: Emergency Medicine

## 2012-01-01 ENCOUNTER — Emergency Department (HOSPITAL_COMMUNITY): Payer: BC Managed Care – PPO

## 2012-01-01 DIAGNOSIS — X500XXA Overexertion from strenuous movement or load, initial encounter: Secondary | ICD-10-CM | POA: Insufficient documentation

## 2012-01-01 DIAGNOSIS — IMO0002 Reserved for concepts with insufficient information to code with codable children: Secondary | ICD-10-CM | POA: Insufficient documentation

## 2012-01-01 DIAGNOSIS — Y9239 Other specified sports and athletic area as the place of occurrence of the external cause: Secondary | ICD-10-CM | POA: Insufficient documentation

## 2012-01-01 DIAGNOSIS — Z8742 Personal history of other diseases of the female genital tract: Secondary | ICD-10-CM | POA: Insufficient documentation

## 2012-01-01 DIAGNOSIS — S3992XA Unspecified injury of lower back, initial encounter: Secondary | ICD-10-CM

## 2012-01-01 DIAGNOSIS — M1990 Unspecified osteoarthritis, unspecified site: Secondary | ICD-10-CM | POA: Insufficient documentation

## 2012-01-01 DIAGNOSIS — Y9389 Activity, other specified: Secondary | ICD-10-CM | POA: Insufficient documentation

## 2012-01-01 DIAGNOSIS — M5430 Sciatica, unspecified side: Secondary | ICD-10-CM | POA: Insufficient documentation

## 2012-01-01 DIAGNOSIS — Z79899 Other long term (current) drug therapy: Secondary | ICD-10-CM | POA: Insufficient documentation

## 2012-01-01 DIAGNOSIS — Z85850 Personal history of malignant neoplasm of thyroid: Secondary | ICD-10-CM | POA: Insufficient documentation

## 2012-01-01 DIAGNOSIS — Z90710 Acquired absence of both cervix and uterus: Secondary | ICD-10-CM | POA: Insufficient documentation

## 2012-01-01 DIAGNOSIS — Z853 Personal history of malignant neoplasm of breast: Secondary | ICD-10-CM | POA: Insufficient documentation

## 2012-01-01 DIAGNOSIS — Z9889 Other specified postprocedural states: Secondary | ICD-10-CM | POA: Insufficient documentation

## 2012-01-01 DIAGNOSIS — F411 Generalized anxiety disorder: Secondary | ICD-10-CM | POA: Insufficient documentation

## 2012-01-01 MED ORDER — PREDNISONE 20 MG PO TABS: 20.0000 mg | ORAL_TABLET | Freq: Two times a day (BID) | ORAL | Status: DC

## 2012-01-01 MED ORDER — ONDANSETRON HCL 8 MG PO TABS: 8.0000 mg | ORAL_TABLET | Freq: Three times a day (TID) | ORAL | Status: DC | PRN

## 2012-01-01 MED ORDER — DIPHENHYDRAMINE HCL 25 MG PO CAPS
25.0000 mg | ORAL_CAPSULE | Freq: Once | ORAL | Status: AC
  Administered 2012-01-01: 25 mg via ORAL
  Filled 2012-01-01: qty 1

## 2012-01-01 MED ORDER — OXYCODONE-ACETAMINOPHEN 5-325 MG PO TABS: 1.0000 | ORAL_TABLET | ORAL | Status: DC | PRN

## 2012-01-01 MED ORDER — HYDROMORPHONE HCL PF 2 MG/ML IJ SOLN
2.0000 mg | Freq: Once | INTRAMUSCULAR | Status: AC
  Administered 2012-01-01: 2 mg via INTRAMUSCULAR
  Filled 2012-01-01: qty 1

## 2012-01-01 MED ORDER — ONDANSETRON 8 MG PO TBDP
8.0000 mg | ORAL_TABLET | Freq: Once | ORAL | Status: AC
  Administered 2012-01-01: 8 mg via ORAL
  Filled 2012-01-01: qty 1

## 2012-01-01 NOTE — ED Provider Notes (Signed)
History     CSN: [id number]  Arrival date & time 01/01/12  0919   First MD Initiated Contact with Patient 01/01/12 [phone]      Chief Complaint  Patient presents with  . Back Pain    Pt reports one week hx of intermittent low back pain    (Consider location/radiation/quality/duration/timing/severity/associated sxs/prior treatment) HPI Comments: Lisa Guzman is a 51 y.o. Female who states that she has severe low back pain present for 1 week, worsened last night, when she bent over. Pain is localized to lower back, and the midline.  It is "shooting, and sharp". There is no radicular component. There is no weakness, bowel or urinary incontinence. She denies fever. She initially aggravated back lifting, weights, one week ago. She has a prior laminectomy for L4-5 disc. She does not have chronic lower back pain. The pain is worse with movement and improves with rest.  Patient is a 51 y.o. female presenting with back pain. The history is provided by the patient.  Back Pain     Past Medical History  Diagnosis Date  . Breast cancer, right breast 02/16/2011  . Hx of thyroid cancer 02/16/2011  . Endometritis 02/16/2011  . Anxiety   . Thyroid disease 1977    Thyroid cancer  . Breast cancer, right breast 02/16/2011  . Breast cancer, right breast 02/16/2011    Past Surgical History  Procedure Date  . Abdominal hysterectomy   . Total thyroidectomy 1977    [facility]  . Breast surgery   . Endometrial ablation   . Breast reconstruction with placement of tissue expander and flex hd (acellular hydrated dermis)     Family History  Problem Relation Age of Onset  . Tuberculosis Mother   . Tuberculosis Father   . Hypertension Father   . Cancer Maternal Grandmother     History  Substance Use Topics  . Smoking status: Never Smoker   . Smokeless tobacco: Not on file  . Alcohol Use: Yes    OB History    Grav Para Term Preterm Abortions TAB SAB Ect Mult Living                  Review of Systems  Musculoskeletal: Positive for back pain.  All other systems reviewed and are negative.    Allergies  Review of patient's allergies indicates no known allergies.  Home Medications   Current Outpatient Rx  Name  Route  Sig  Dispense  Refill  . BUPROPION HCL ER (XL) 300 MG PO TB24   Oral   Take 300 mg by mouth every morning.          Marland Kitchen VITAMIN D 1000 UNITS PO TABS   Oral   Take 1,000 Units by mouth daily.         Marland Kitchen LEVOTHYROXINE SODIUM 175 MCG PO TABS   Oral   Take 175 mcg by mouth every morning.          Marland Kitchen METHOCARBAMOL 500 MG PO TABS   Oral   Take 500 mg by mouth 2 (two) times daily as needed. Muscle spasms         . RELPAX 40 MG PO TABS      One tablet by mouth at onset of headache. May repeat in 2 hours if headache persists or recurs.         Marland Kitchen ROSUVASTATIN CALCIUM 10 MG PO TABS   Oral   Take 10 mg by mouth daily.         Marland Kitchen  TAMOXIFEN CITRATE 20 MG PO TABS   Oral   Take 20 mg by mouth every morning.         Marland Kitchen ONDANSETRON HCL 8 MG PO TABS   Oral   Take 1 tablet (8 mg total) by mouth every 8 (eight) hours as needed for nausea.   20 tablet   0   . OXYCODONE-ACETAMINOPHEN 5-325 MG PO TABS   Oral   Take 1 tablet by mouth every 4 (four) hours as needed for pain.   20 tablet   0   . PREDNISONE 20 MG PO TABS   Oral   Take 1 tablet (20 mg total) by mouth 2 (two) times daily.   10 tablet   0     BP 119/72  Pulse 66  Temp 97.7 F (36.5 C) (Oral)  Resp 18  SpO2 100%  Physical Exam  Nursing note and vitals reviewed. Constitutional: She is oriented to person, place, and time. She appears well-developed and well-nourished. She appears distressed (uncomfortable).  HENT:  Head: Normocephalic and atraumatic.  Eyes: Conjunctivae normal and EOM are normal. Pupils are equal, round, and reactive to light.  Neck: Normal range of motion and phonation normal. Neck supple.  Cardiovascular: Normal rate, regular rhythm and intact  distal pulses.   Pulmonary/Chest: Effort normal and breath sounds normal. She exhibits no tenderness.  Abdominal: Soft. She exhibits no distension. There is no tenderness. There is no guarding.  Musculoskeletal: Normal range of motion.       No tenderness of the lumbar spine. Diminished spine. Extension, secondary to lumbar discomfort. Negative straight leg raising bilaterally.  Neurological: She is alert and oriented to person, place, and time. She has normal strength. She exhibits normal muscle tone.  Skin: Skin is warm and dry.  Psychiatric: She has a normal mood and affect. Her behavior is normal. Judgment and thought content normal.    ED Course  Procedures (including critical care time)  Analgesia ordered. Imaging ordered.  Reevaluation: Pain improved. N/V treated with Zofran- felt secondary to the narcotic  Labs Reviewed - No data to display Dg Lumbar Spine Complete  01/01/2012  *RADIOLOGY REPORT*  Clinical Data: Progressive low back pain.  LUMBAR SPINE - COMPLETE 4+ VIEW  Comparison: 11/28/2005  Findings: No evidence of acute fracture, spondylolysis, or spondylolisthesis.  Mild degenerative disc disease at L4-5 and L5-S1 appears unchanged. Bilateral facet DJD is seen from levels of L3-S1 which is mild to moderate in degree.  No focal lytic or sclerotic bone lesions seen. No significant change compared to prior two-view exam peri  IMPRESSION:  1.  No acute findings. 2.  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   Original Report Authenticated By: Myles Rosenthal, M.D.      1. Degenerative joint disease   2. Back injury       MDM  LBP with DDD/DJD. Doubt cauda equina, sciatica, or fracture. She is stable for d/c with sx care.   Plan: Home Medications- Prednisone, Percocet, Zofran; Home Treatments- rest, ice/heat; Recommended follow up- PCP/Ortho prn       Flint Melter, MD 01/02/12 1326

## 2012-01-01 NOTE — ED Notes (Signed)
Pt report a "pulling " sensation in low back while working out in gym one week ago. Pain intermittent with dramatic increased in last 12 hrs. Pt c/o difficulty breathing  due to pain on movement

## 2012-01-01 NOTE — ED Notes (Signed)
Patient transported to X-ray 

## 2012-01-28 ENCOUNTER — Other Ambulatory Visit: Payer: Self-pay | Admitting: Oncology

## 2012-08-15 ENCOUNTER — Telehealth: Payer: Self-pay | Admitting: *Deleted

## 2012-08-15 NOTE — Telephone Encounter (Signed)
Left vm for pt to return call to schedule f/u with new provider d/t Dr. Donnie Coffin leaving cancer center.

## 2012-08-17 ENCOUNTER — Encounter: Payer: Self-pay | Admitting: *Deleted

## 2012-08-17 ENCOUNTER — Telehealth: Payer: Self-pay | Admitting: *Deleted

## 2012-08-17 NOTE — Telephone Encounter (Signed)
Spoke to pt concerning scheduling appt with new provider.  Pt request Dr. Darnelle Catalan.  Scheduled pt with Norina Buzzard, NP on 09/05/12 at 0900.  Labs at 0830. Confirmed appt date and time.  Pt denies further needs at this time.

## 2012-08-17 NOTE — Progress Notes (Signed)
Mailed letter and calendar with new appt. 

## 2012-09-04 ENCOUNTER — Other Ambulatory Visit: Payer: Self-pay | Admitting: Physician Assistant

## 2012-09-04 DIAGNOSIS — C50911 Malignant neoplasm of unspecified site of right female breast: Secondary | ICD-10-CM

## 2012-09-05 ENCOUNTER — Other Ambulatory Visit (HOSPITAL_BASED_OUTPATIENT_CLINIC_OR_DEPARTMENT_OTHER): Payer: BC Managed Care – PPO | Admitting: Lab

## 2012-09-05 ENCOUNTER — Ambulatory Visit (HOSPITAL_BASED_OUTPATIENT_CLINIC_OR_DEPARTMENT_OTHER): Payer: BC Managed Care – PPO | Admitting: Family

## 2012-09-05 ENCOUNTER — Encounter: Payer: Self-pay | Admitting: Family

## 2012-09-05 ENCOUNTER — Telehealth: Payer: Self-pay | Admitting: Oncology

## 2012-09-05 VITALS — BP 116/72 | HR 60 | Temp 98.3°F | Resp 18 | Ht 68.0 in | Wt 151.1 lb

## 2012-09-05 DIAGNOSIS — Z853 Personal history of malignant neoplasm of breast: Secondary | ICD-10-CM

## 2012-09-05 DIAGNOSIS — C50419 Malignant neoplasm of upper-outer quadrant of unspecified female breast: Secondary | ICD-10-CM

## 2012-09-05 DIAGNOSIS — C50911 Malignant neoplasm of unspecified site of right female breast: Secondary | ICD-10-CM

## 2012-09-05 DIAGNOSIS — N951 Menopausal and female climacteric states: Secondary | ICD-10-CM

## 2012-09-05 DIAGNOSIS — IMO0002 Reserved for concepts with insufficient information to code with codable children: Secondary | ICD-10-CM

## 2012-09-05 LAB — COMPREHENSIVE METABOLIC PANEL (CC13)
AST: 25 U/L (ref 5–34)
BUN: 11.7 mg/dL (ref 7.0–26.0)
Calcium: 8.5 mg/dL (ref 8.4–10.4)
Chloride: 110 mEq/L — ABNORMAL HIGH (ref 98–109)
Creatinine: 0.9 mg/dL (ref 0.6–1.1)
Total Bilirubin: 0.29 mg/dL (ref 0.20–1.20)

## 2012-09-05 LAB — CBC WITH DIFFERENTIAL/PLATELET
BASO%: 1.1 % (ref 0.0–2.0)
Basophils Absolute: 0.1 10*3/uL (ref 0.0–0.1)
EOS%: 2.7 % (ref 0.0–7.0)
HCT: 38.8 % (ref 34.8–46.6)
HGB: 13.1 g/dL (ref 11.6–15.9)
LYMPH%: 42.7 % (ref 14.0–49.7)
MCH: 30.6 pg (ref 25.1–34.0)
MCHC: 33.9 g/dL (ref 31.5–36.0)
MCV: 90.3 fL (ref 79.5–101.0)
NEUT%: 45.4 % (ref 38.4–76.8)
Platelets: 255 10*3/uL (ref 145–400)
lymph#: 2.6 10*3/uL (ref 0.9–3.3)

## 2012-09-05 MED ORDER — ESTRADIOL 10 MCG VA TABS: 10.0000 ug | ORAL_TABLET | VAGINAL | Status: DC

## 2012-09-05 NOTE — Progress Notes (Addendum)
[facility]  [phone] [phone]  OFFICE PROGRESS NOTE   ID: Lisa Guzman   DOB: August 02, 1960  MR#: [id number]  JYN#:[id number]   PCP: Lilia Argue SU: Cyndia Bent, M.D. SU: Pleas Patricia, M.D.    HISTORY OF PRESENT ILLNESS: From Dr. Theron Arista Rubin's the patient evaluation dated 11/30/2005: "This is a delightful 52 year old woman from [facility] referred by Dr. Jamey Ripa for evaluation for neoadjuvant chemotherapy.  This woman had been in quite good health.  She has undergone routine mammography on an annual basis.  Her last mammogram was in March 2007.  She typically has fairly dense breasts and noted a mass in her right breast about 6 weeks ago.  She was in turn sent for a mammogram that was done at [facility].  A subsequent ultrasound done at [facility] on 11/07/05 showed an irregular mass-like hypoechoic area measuring 2.7 x 2 x 1.5 cm.  The physical exam at that time showed an area of papular nodularity in the upper outer aspect of the breast.    A biopsy was recommended.  Biopsy took place on 11/07/05, the results of which showed features suspicious for lymphovascular space involvement by tumor.  There are no discrete tumor cells seen, but there were atypical neoplastic epithelial cells see in the lymphovascular space.  Insufficient tissue was available for prognostic panel.    A second biopsy has been done recently, for that purpose, on 11/29/05.  MRI scan of both breasts done on 11/16/05 showed a solitary enhancing mass in the right subareolar area measuring 3.3 x 1.7 x 1.8 cm.  The left breast was negative.    Ms. Piggott has since undergone fairly extensive staging evaluation with CT/PET, bone scan.  The CT scan and PET scan showed postsurgical changes in the upper chest secondary to previous thyroid surgery.  Finding of subpleural nodules, right lower lobe.  Subcentimeter low-density lesion, anterior aspect of the left  hepatic lobe, suggestive of cysts.    The PET scan done at the same time showed potential findings for micrometastases in the lymph nodes of the right axilla.  The tumor at the subareolar location had an SUV of 2.9.  A baseline 2D echo showed ejection fraction of 55% with normal wall motion.    Ms. Favela has done well otherwise and feels quite well.  She has not had nipple retraction or skin changes."  Her subsequent history is as detailed below.    INTERVAL HISTORY: Dr. 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  The patient was last seen by Dr. Donnie Coffin on 11/11/2011.  Since her last office visit, the patient has been doing relatively well.  She is establishing herself with Dr. Darrall Dears service today.   REVIEW OF SYSTEMS: A 10 point review of systems was completed and is negative except for vaginal dryness and this dyspareunia.  The patient states that she has frequent vaginal fissures which subsequently lead to frequent urinary tract infections.  She denies any other symptomatology and her review of systems is otherwise noncontributory.   PAST MEDICAL HISTORY: Past Medical History  Diagnosis Date  . Hx of thyroid cancer 02/16/2011  . Thyroid disease 1977    Thyroid cancer  . Migraine   . Breast cancer, right breast 02/16/2011    Invasive ductal carcinoma  . Anxiety   . Endometriosis   . Hyperlipidemia   History of thyroid cancer in 1977.  She underwent  surgery at [facility] in [place], [place].  She apparently had a tumor in her thyroid with 5 involved lymph nodes.  She did have what sounds like external beam radiation therapy.  We do not have any records regarding the thyroid cancer.   PAST SURGICAL HISTORY: Past Surgical History  Procedure Laterality Date  . Abdominal hysterectomy      TAH - BSO  . Total thyroidectomy  1977    [facility]  . Breast surgery      Lumpectomy (06/2006)   Bilateral mastectomy (08/2006)  . Endometrial ablation       Ablation x 3  . Breast reconstruction with placement of tissue expander and flex hd (acellular hydrated dermis)    . Laminectomy  2007    L4-L5 Laminectomy  . Tonsillectomy and adenoidectomy      Age 55  In 2001, she underwent hysterectomy with bilateral oophorectomy for what sounds like secondary to endometriosis and complications of bleeding.  She was found also to have fibroid tumor. She has also had laser surgery for endometriosis a number of times.  In March 2007, she had a microdiskectomy of the lumbar spine.       FAMILY HISTORY Family History  Problem Relation Age of Onset  . Tuberculosis Mother   . Tuberculosis Father   . Hypertension Father   . Cancer Father     Prostate cancer  . Cancer Maternal Grandmother     Breast cancer  . Dementia Maternal Grandmother   . Diabetes Brother   Both parents have had tuberculosis and actually met in a sanitarium.  She has a maternal grandmother who had breast cancer in  her late eighties or early nineties.  She has two brothers and one sister.  One lives in [place] and two live in [place] state. Her parents currently live in [place].   There is no other history of breast or ovarian cancer in the family.   GYNECOLOGIC HISTORY: Gravida 1 para 2 (a set identical twins), menses at age 62, age of parity 43, hysterectomy at age 22, use birth control pills from ages 51 through 68, used estradiol hormone replacement therapy for 6-7 years after hysterectomy.  SOCIAL HISTORY: Mr. and Mrs. Bertsch has been married since 12.  They have twin 54 year-old boys.  They have one grandchild on the way.  They have lived in [place] since 2006. Her husband is the Tourist information centre manager, Psychologist, sport and exercise.  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  She currently volunteers at the surgical waiting area, but has previously been a volunteer at the  front desk of the [facility] breast cancer center.  In her spare time she enjoys working out, studying to speaks [place], cooking and reading.   ADVANCED DIRECTIVES: Not on file   HEALTH MAINTENANCE: History  Substance Use Topics  . Smoking status: Former Games developer  . Smokeless tobacco: Never Used     Comment: Smoked cigaretts from ages [age]  . Alcohol Use: 4.2 oz/week    7 Glasses of wine per week    Colonoscopy: Not on file PAP: Not on file Bone density: The patient's last bone density scan on 12/28/2010 showed a T score of -0.4 (normal). Lipid panel: Not on file  No Known Allergies  Current Outpatient Prescriptions  Medication Sig Dispense Refill  . ALPRAZolam (XANAX) 0.5 MG tablet Take 0.5 mg by mouth as needed for sleep.      Marland Kitchen  cholecalciferol (VITAMIN D) 1000 UNITS tablet Take 1,000 Units by mouth daily.      Marland Kitchen levothyroxine (SYNTHROID, LEVOTHROID) 175 MCG tablet Take 175 mcg by mouth every morning.       Marland Kitchen RELPAX 40 MG tablet Take 40 mg by mouth as needed.       . rosuvastatin (CRESTOR) 10 MG tablet Take 10 mg by mouth daily.      . tamoxifen (NOLVADEX) 20 MG tablet TAKE 1 TABLET EVERY DAY  30 tablet  11  . Estradiol (VAGIFEM) 10 MCG TABS vaginal tablet Place 1 tablet (10 mcg total) vaginally 3 (three) times a week.  12 tablet  12   No current facility-administered medications for this visit.    OBJECTIVE: Filed Vitals:   09/05/12 0849  BP: 116/72  Pulse: 60  Temp: 98.3 F (36.8 C)  Resp: 18     Body mass index is 22.98 kg/(m^2).      ECOG FS: 1 - Symptomatic but completely ambulatory  General appearance: Alert, cooperative, well nourished, no apparent distress Head: Normocephalic, without obvious abnormality, atraumatic Eyes: Conjunctivae/corneas clear, PERRLA, EOMI Nose: Nares, septum and mucosa are normal, no drainage or sinus tenderness Neck: No adenopathy, supple, symmetrical, trachea midline, thyroid area scar, no tenderness Resp: Clear to auscultation  bilaterally Cardio: Regular rate and rhythm, S1, S2 normal, no murmur, click, rub or gallop Breasts: Reconstructed bilaterally, bilateral breast have well-healed surgical scars, no lymphadenopathy, nipple tattoos, no axilla fullness GI: Soft, not distended, non-tender, hypoactive bowel sounds, no organomegaly Extremities: Extremities normal, atraumatic, no cyanosis or edema Lymph nodes: Cervical, supraclavicular, and axillary nodes normal Neurologic: Grossly normal    LAB RESULTS: Lab Results  Component Value Date   WBC 6.0 09/05/2012   NEUTROABS 2.7 09/05/2012   HGB 13.1 09/05/2012   HCT 38.8 09/05/2012   MCV 90.3 09/05/2012   PLT 255 09/05/2012      Chemistry      Component Value Date/Time   NA 140 09/05/2012 0812   NA 134* 02/16/2011 1127   K 4.1 09/05/2012 0812   K 3.8 02/16/2011 1127   CL 103 11/11/2011 1507   CL 103 02/16/2011 1127   CO2 20* 09/05/2012 0812   CO2 27 02/16/2011 1127   BUN 11.7 09/05/2012 0812   BUN 11 02/16/2011 1127   CREATININE 0.9 09/05/2012 0812   CREATININE 0.89 02/16/2011 1127      Component Value Date/Time   CALCIUM 8.5 09/05/2012 0812   CALCIUM 8.9 02/16/2011 1127   ALKPHOS 64 09/05/2012 0812   ALKPHOS 76 02/16/2011 1127   AST 25 09/05/2012 0812   AST 21 02/16/2011 1127   ALT 19 09/05/2012 0812   ALT 15 02/16/2011 1127   BILITOT 0.29 09/05/2012 0812   BILITOT 0.3 02/16/2011 1127       Lab Results  Component Value Date   LABCA2 23 11/11/2011    Urinalysis    Component Value Date/Time   LABSPEC 1.010 02/21/2006 1015    STUDIES: 1.  The patient's last bone density scan on 12/28/2010 showed a T score of -0.4 (normal).   ASSESSMENT: 52 y.o. woman: 1.  The patient has a history of thyroid cancer and underwent thyroidectomy surgery in 1977 at [facility] in [place], [place].  She had a tumor in her thyroid with 5 involved lymph nodes.  She completed external beam radiation therapy/radioactive iodine ablation.    2.  The patient had a  right breast needle core biopsy on 11/07/2005 which  showed features highly suspicious for lymphovascular space involvement by tumor, atypical ductal hyperplasia and hyalinized fibrosis.  3.  The patient had a bilateral breast MRI on 11/16/2005 which showed a dense enhancing pattern, bilaterally.  In the right subareolar region, there was a solitary obscured enhancing mass. The dimensions are difficult to give secondary to the patient's bilateral dense enhancing pattern.  It measured approximately 3.3 x 1.7 x 1.8 cm. in transverse, craniocaudal and anterior posterior dimensions. There were no abnormal enhancements in the left breast. No enlarged axillary adenopathy is detected (clinical stage IIA, T2 N0).    4.  The patient had a left breast needle core biopsy at the 11-12 o'clock position on 11/29/2005 which showed invasive mammary carcinoma, estrogen receptor 100% positive, progesterone receptor 50% positive, Ki-67 15%, HER-2/neu 2+ equivocal, HER-2/neu by FISH showed no amplification with a ratio of 1.1.  5.  The patient became a participant in a research study NSABP B-40 in 11/2005.  6.  The patient had neoadjuvant chemotherapy in accordance with research study protocol NSABP B-40 consisting of Taxotere/Gemzar/Avastin from 12/21/2005 through 03/08/2006 x 4 cycles with Neupogen support.  This was followed by neoadjuvant chemotherapy per B-40 research protocol consisting of Adriamycin/Cytoxan/Avastin from 03/22/2006 through 05/24/2006 x 4 cycles with Neulasta support.  7.  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   The tumor was located immediately subjacent to the nipple and the  nipple/superficial margin was involved. She has positive surgical margin that required reexcision of the nipple-areola complex.  8.  Status post right breast simple mastectomy and left breast simple mastectomy on 09/06/2006 which showed in the right breast no residual carcinoma identified, margins negative, no evidence of Paget's disease of the nipple, 2 fibroadenomas, and the left breast showed no carcinoma identified, 0.2 cm fibroadenoma, fibrocystic change associated with microcalcifications, including ductal ectasia, alteration without atypia, adenosis, and fibrosis, margins negative.  Reconstruction completed.  9.  The patient received genetic counseling and testing and comprehensive BRACAnalysis report dated 08/10/2006 showed no mutation detected in the BRCA1 or BRCA2 breast cancer genes.  10.  The patient restarted Avastin therapy in 09/2006 and it was discontinued 10/2006 due to grade 3 myalgias and arthralgias.  Avastin discontinuation ended her participation in NSABP B40 protocol study.  11.  The patient started antiestrogen therapy with Arimidex in 11/2006.   Antiestrogen therapy was changed to Femara in 11/2007.  The patient started antiestrogen therapy with Tamoxifen in 07/2010.  12.  Vaginal dryness/dyspareunia  PLAN: The patient will continue antiestrogen therapy with Tamoxifen, with planned therapy scheduled to continue until 11/2016.  An electronic prescription for Vagifem 10 mcg place 1 tablet vaginally 3 times a week #12 which also refills was sent to the patient's pharmacy for her vaginal dryness/dyspareunia.  We plan to see Mrs. Charpentier again in one year at which time we will check a CBC, CMP, LDH, and vitamin D level.  She is due for repeat bone density scan in 12/2012 and we will schedule this for her.  All questions were answered.  The patient was encouraged to contact us in the interim with any problems, questions or concerns.   Larina Bras, NP-C 09/05/2012, 7:02  PM  ADDENDUM: Labella established herself in my practice today.  She is a 52 year old BRCA negative [place] woman who underwent right breast biopsy November of 2007 for a clinical T2 N0, stage IIA invasive ductal carcinoma, estrogen and progesterone receptor positive, with no HER-2 amplification and an MIB-1 of 15%. She was treated neo-adjuvantly at according to NSABP B-40, receiving docetaxel and gemcitabine together with bevacizumab x4 followed by doxorubicin and cyclophosphamide again given with bevacizumab for 4 cycles  All adjuvant chemotherapy was completed may of 2008 and in June of 2008 the patient underwent right lumpectomy and axillary lymph node dissection (a total of 20 lymph nodes were removed) for a residual yp T2 ypN1 invasive ductal carcinoma, grade 2. Unfortunately margins were positive and the patient underwent bilateral mastectomies August of 2008. The left breast was benign and there was no residual tumor in the right breast.  The patient underwent bilateral expander reconstruction. She did not receive adjuvant radiation. She continued bevacizumab until October of 2008, at which point he was discontinued because of myalgias. Anti-estrogens followed, and after a brief trial of anastrozole, which was poorly tolerated, she started letrozole November of 2009. She continued on letrozole until she switched to tamoxifen July of 2012.  Claudene understands standard antiestrogen therapy generally stops at 5 years. We do not have data that says we must stop at 5 years and many practitioners in selected cases ALT to continue treatment for 10 years, with the expectation that there may be a small additional benefit. This of course has been established for patients who take tamoxifen only, without any use of aromatase inhibitors.  After much discussion we decided that given her history and age, we would continue antiestrogen therapy for a total of 10 years: Specifically she will be on tamoxifen on  until November of 2018.  An additional reason for this decision is a consideration that her dyspareunia and other symptoms of vaginal atrophy can be ameliorated with local estrogen preparations safely while she is on tamoxifen. (Recall she is status post hysterectomy). Accordingly, that is the plan, and we gave her prescriptions for Vagifem suppositories today together with instructions on how to use this agent.  She will call for any problems that may develop before her next visit here, which will be a year from now.  I personally saw this patient and performed a substantive portion of this encounter with the listed APP documented above.   Lowella Dell, MD

## 2012-09-05 NOTE — Telephone Encounter (Signed)
gv pt appt schedule for August 2015 and bone density for 01/01/13 @ [facility].

## 2012-09-05 NOTE — Patient Instructions (Signed)
Please contact us at [phone] if you have any questions or concerns.  Please continue to do well and enjoy life!!!  Get plenty of rest, drink plenty of water, exercise daily (walking), eat a balanced diet.  Complete monthly self-breast examinations.  Have a clinical breast exam by a physician every year.  Results for orders placed in visit on 09/05/12 (from the past 24 hour(s))  CBC WITH DIFFERENTIAL     Status: None   Collection Time    09/05/12  8:11 AM      Result Value Range   WBC 6.0  3.9 - 10.3 10e3/uL   NEUT# 2.7  1.5 - 6.5 10e3/uL   HGB 13.1  11.6 - 15.9 g/dL   HCT 16.1  09.6 - 04.5 %   Platelets 255  145 - 400 10e3/uL   MCV 90.3  79.5 - 101.0 fL   MCH 30.6  25.1 - 34.0 pg   MCHC 33.9  31.5 - 36.0 g/dL   RBC 4.09  8.11 - 9.14 10e6/uL   RDW 12.8  11.2 - 14.5 %   lymph# 2.6  0.9 - 3.3 10e3/uL   MONO# 0.5  0.1 - 0.9 10e3/uL   Eosinophils Absolute 0.2  0.0 - 0.5 10e3/uL   Basophils Absolute 0.1  0.0 - 0.1 10e3/uL   NEUT% 45.4  38.4 - 76.8 %   LYMPH% 42.7  14.0 - 49.7 %   MONO% 8.1  0.0 - 14.0 %   EOS% 2.7  0.0 - 7.0 %   BASO% 1.1  0.0 - 2.0 %   Narrative:    Performed At:  [facility]               [address]               [place], [place] [address]  COMPREHENSIVE METABOLIC PANEL (CC13)     Status: Abnormal   Collection Time    09/05/12  8:12 AM      Result Value Range   Sodium 140  136 - 145 mEq/L   Potassium 4.1  3.5 - 5.1 mEq/L   Chloride 110 (*) 98 - 109 mEq/L   CO2 20 (*) 22 - 29 mEq/L   Glucose 101  70 - 140 mg/dl   BUN 62.1  7.0 - 30.8 mg/dL   Creatinine 0.9  0.6 - 1.1 mg/dL   Total Bilirubin 6.57  0.20 - 1.20 mg/dL   Alkaline Phosphatase 64  40 - 150 U/L   AST 25  5 - 34 U/L   ALT 19  0 - 55 U/L   Total Protein 6.6  6.4 - 8.3 g/dL   Albumin 3.5  3.5 - 5.0 g/dL   Calcium 8.5  8.4 - 84.6 mg/dL   Narrative:    Note: New Reference ranges.Performed At:  [facility]               [address]               [place],  [place] [address]

## 2012-11-13 ENCOUNTER — Other Ambulatory Visit: Payer: BC Managed Care – PPO | Admitting: Lab

## 2012-11-13 ENCOUNTER — Ambulatory Visit: Payer: BC Managed Care – PPO | Admitting: Oncology

## 2013-01-01 ENCOUNTER — Other Ambulatory Visit: Payer: BC Managed Care – PPO

## 2013-01-30 ENCOUNTER — Other Ambulatory Visit: Payer: Self-pay | Admitting: Family

## 2013-02-05 ENCOUNTER — Other Ambulatory Visit: Payer: Self-pay | Admitting: *Deleted

## 2013-02-05 DIAGNOSIS — Z853 Personal history of malignant neoplasm of breast: Secondary | ICD-10-CM

## 2013-02-05 MED ORDER — TAMOXIFEN CITRATE 20 MG PO TABS: ORAL_TABLET | ORAL | Status: DC

## 2013-08-29 ENCOUNTER — Other Ambulatory Visit: Payer: Self-pay | Admitting: *Deleted

## 2013-08-29 ENCOUNTER — Other Ambulatory Visit: Payer: BC Managed Care – PPO

## 2013-08-29 ENCOUNTER — Other Ambulatory Visit (HOSPITAL_BASED_OUTPATIENT_CLINIC_OR_DEPARTMENT_OTHER): Payer: BC Managed Care – PPO

## 2013-08-29 ENCOUNTER — Telehealth: Payer: Self-pay | Admitting: Oncology

## 2013-08-29 DIAGNOSIS — C50911 Malignant neoplasm of unspecified site of right female breast: Secondary | ICD-10-CM

## 2013-08-29 DIAGNOSIS — C50419 Malignant neoplasm of upper-outer quadrant of unspecified female breast: Secondary | ICD-10-CM

## 2013-08-29 LAB — CBC WITH DIFFERENTIAL/PLATELET
BASO%: 0.5 % (ref 0.0–2.0)
Basophils Absolute: 0 10*3/uL (ref 0.0–0.1)
EOS ABS: 0.1 10*3/uL (ref 0.0–0.5)
EOS%: 1.5 % (ref 0.0–7.0)
HCT: 38 % (ref 34.8–46.6)
HGB: 13 g/dL (ref 11.6–15.9)
LYMPH%: 37.9 % (ref 14.0–49.7)
MCH: 30.4 pg (ref 25.1–34.0)
MCHC: 34.2 g/dL (ref 31.5–36.0)
MCV: 89 fL (ref 79.5–101.0)
MONO#: 0.6 10*3/uL (ref 0.1–0.9)
MONO%: 9.4 % (ref 0.0–14.0)
NEUT%: 50.7 % (ref 38.4–76.8)
NEUTROS ABS: 3 10*3/uL (ref 1.5–6.5)
Platelets: 249 10*3/uL (ref 145–400)
RBC: 4.27 10*6/uL (ref 3.70–5.45)
RDW: 13.2 % (ref 11.2–14.5)
WBC: 5.9 10*3/uL (ref 3.9–10.3)
lymph#: 2.3 10*3/uL (ref 0.9–3.3)

## 2013-08-29 LAB — COMPREHENSIVE METABOLIC PANEL (CC13)
ALBUMIN: 3.9 g/dL (ref 3.5–5.0)
ALK PHOS: 54 U/L (ref 40–150)
ALT: 18 U/L (ref 0–55)
AST: 26 U/L (ref 5–34)
Anion Gap: 8 mEq/L (ref 3–11)
BILIRUBIN TOTAL: 0.33 mg/dL (ref 0.20–1.20)
BUN: 15.3 mg/dL (ref 7.0–26.0)
CO2: 25 mEq/L (ref 22–29)
Calcium: 8.9 mg/dL (ref 8.4–10.4)
Chloride: 105 mEq/L (ref 98–109)
Creatinine: 0.9 mg/dL (ref 0.6–1.1)
Glucose: 102 mg/dl (ref 70–140)
POTASSIUM: 4.3 meq/L (ref 3.5–5.1)
SODIUM: 138 meq/L (ref 136–145)
TOTAL PROTEIN: 6.7 g/dL (ref 6.4–8.3)

## 2013-08-29 LAB — LACTATE DEHYDROGENASE (CC13): LDH: 182 U/L (ref 125–245)

## 2013-08-29 NOTE — Telephone Encounter (Signed)
pt cld to [facility] lab to 11:45-pt aware of time

## 2013-08-30 ENCOUNTER — Telehealth: Payer: Self-pay | Admitting: Oncology

## 2013-08-30 LAB — VITAMIN D 25 HYDROXY (VIT D DEFICIENCY, FRACTURES): Vit D, 25-Hydroxy: 42 ng/mL (ref 30–89)

## 2013-08-30 NOTE — Telephone Encounter (Signed)
Cld & spoke to pt in re to [facility] appt-pt understood-gave pt new time same date-pt understood

## 2013-09-05 ENCOUNTER — Ambulatory Visit: Payer: BC Managed Care – PPO

## 2013-09-05 ENCOUNTER — Ambulatory Visit (HOSPITAL_BASED_OUTPATIENT_CLINIC_OR_DEPARTMENT_OTHER): Payer: BC Managed Care – PPO | Admitting: Hematology

## 2013-09-05 ENCOUNTER — Ambulatory Visit: Payer: BC Managed Care – PPO | Admitting: Oncology

## 2013-09-05 ENCOUNTER — Encounter: Payer: Self-pay | Admitting: Hematology

## 2013-09-05 VITALS — BP 114/60 | HR 62 | Temp 97.8°F | Resp 18 | Ht 68.0 in | Wt 144.6 lb

## 2013-09-05 DIAGNOSIS — C50419 Malignant neoplasm of upper-outer quadrant of unspecified female breast: Secondary | ICD-10-CM

## 2013-09-05 DIAGNOSIS — C50919 Malignant neoplasm of unspecified site of unspecified female breast: Secondary | ICD-10-CM

## 2013-09-05 DIAGNOSIS — Z85850 Personal history of malignant neoplasm of thyroid: Secondary | ICD-10-CM

## 2013-09-05 DIAGNOSIS — IMO0002 Reserved for concepts with insufficient information to code with codable children: Secondary | ICD-10-CM

## 2013-09-05 NOTE — Progress Notes (Signed)
[place]  Telephone:([phone]) [phone] Fax:(336) [phone]  OFFICE PROGRESS NOTE   ID: Lisa Guzman   DOB: [birth year]  MR#: [id number]  OZH#:[id number]   PCP: Lisa Guzman SU: Lisa Guzman, M.D. SU: Lisa Guzman, M.D.  REASON FOR OFFICE VISIT: Breast cancer follow up.   PATIENT IDENTIFICATION:  This is a 53 years old patient from [place] who was last seen here on 09/05/2012 by Lisa Ards NP and comes for a follow up visit. She also have a known history of thyroid cancer when she was young treated with surgery and RAI. She has undergone routine mammography on an annual basis.  Her last mammogram was in March 2007.  She typically has fairly dense breasts and noted a mass in her right breast.  She was in turn sent for a mammogram that was done at [facility].  A subsequent ultrasound done at [facility] on 11/07/05 showed an irregular mass-like hypoechoic area measuring 2.7 x 2 x 1.5 cm.  The physical exam at that time showed an area of papular nodularity in the upper outer aspect of the breast.  Biopsy took place on 11/07/05, the results of which showed features suspicious for lymphovascular space involvement by tumor.  There are no discrete tumor cells seen, but there were atypical neoplastic epithelial cells see in the lymphovascular space.  Insufficient tissue was available for prognostic panel.  A second biopsy has been done on 11/29/05.  MRI scan of both breasts done on 11/16/05 showed a solitary enhancing mass in the right subareolar area measuring 3.3 x 1.7 x 1.8 cm.  The left breast was negative.  (clinical stage IIA, T2 N0).    Lisa Guzman has since undergone fairly extensive staging evaluation with CT/PET, bone scan.  The CT scan and PET scan showed postsurgical changes in the upper chest secondary to previous thyroid surgery.  Finding of subpleural nodules, right lower lobe.  Subcentimeter low-density lesion, anterior aspect of the  left hepatic lobe, suggestive of cysts.  The PET scan done at the same time showed potential findings for micrometastases in the lymph nodes of the right axilla.  The tumor at the subareolar location had an SUV of 2.9.      A baseline 2D echo showed ejection fraction of 55% with normal wall motion.The patient had a right breast needle core biopsy on 11/07/2005 which showed features highly suspicious for lymphovascular space involvement by tumor, atypical ductal hyperplasia and hyalinized fibrosis.The patient had a left breast needle core biopsy at the 11-12 o'clock position on 11/29/2005 which showed invasive mammary carcinoma, estrogen receptor 100% positive, progesterone receptor 50% positive, Ki-67 15%, HER-2/neu 2+ equivocal, HER-2/neu by FISH showed no amplification with a ratio of 1.1.  The patient became a participant in a research study NSABP B-40 in 11/2005. The patient had neoadjuvant chemotherapy in accordance with research study protocol NSABP B-40 consisting of Taxotere/Gemzar/Avastin from 12/21/2005 through 03/08/2006 x 4 cycles with Neupogen support.  This was followed by neoadjuvant chemotherapy per B-40 research protocol consisting of Adriamycin/Cytoxan/Avastin from 03/22/2006 through 05/24/2006 x 4 cycles with Neulasta support. 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   The tumor was located immediately subjacent to the nipple and the nipple/superficial margin  was involved. She has positive surgical margin that required reexcision of the nipple-areola complex. Patient then underwent right breast simple mastectomy and left breast simple mastectomy on 09/06/2006 which  showed in the right breast no residual carcinoma identified, margins negative, no evidence of Paget's disease of the nipple, 2 fibroadenomas, and the left breast showed no carcinoma identified, 0.2 cm fibroadenoma, fibrocystic change associated with microcalcifications, including ductal ectasia, alteration without atypia, adenosis, and fibrosis, margins negative.  Reconstruction completed. The patient received genetic counseling and testing and comprehensive BRACAnalysis report dated 08/10/2006 showed no mutation detected in the BRCA1 or BRCA2 breast cancer genes. The patient restarted Avastin therapy in 09/2006 and it was discontinued 10/2006 due to grade 3 myalgias and arthralgias.  Avastin discontinuation ended her participation in NSABP B40 protocol study. The patient started antiestrogen therapy with Arimidex in 11/2006.   Antiestrogen therapy was changed to Femara in 11/2007.  The patient started antiestrogen therapy with Tamoxifen in 07/2010. Her last visit was on 09/05/2012 and she comes today for her annual follow up.     INTERVAL HISTORY:  Patient works as a Psychologist, occupational in Clinical cytogeneticist two days a week. She has two twin sons 60 one is doing good in studies and one suffers from bipolar disorder and that causes some stress for the patient. He has shown some volatile behaviour in past and currently living on his own not with his parents. Patient does have hx of colon polyps and last scope was 3-4 years ago where 2 benign polyps were removed. We will set up another colonoscopy for her with Dr Lisa Guzman. She may have some genetic unrecognized mutation similar to p53 with her hx of breast cancer, thyroid cancer and colon polyps. Her father also have hx of colon polyps. Her last pap smear was about a year ago. I do not see a bone density test being done although an order was placed by NP on the last visit. The one in 2012 was normal. We have been checking her Vitamin D levels and LDH levels also and  not sure why we check annual LDH levels? Overall she is doing well. She just had some facial injection done yesterday for cosmetic reasons.   REVIEW OF SYSTEMS: A 10 point review of systems was completed and is negative except for vaginal dryness and this dyspareunia.  The patient states that she has frequent vaginal fissures which subsequently lead to frequent urinary tract infections.  She denies any other symptomatology and her review of systems is otherwise noncontributory.   PAST MEDICAL HISTORY: Past Medical History  Diagnosis Date  . Hx of thyroid cancer 02/16/2011  . Thyroid disease 1977    Thyroid cancer  . Migraine   . Breast cancer, right breast 02/16/2011    Invasive ductal carcinoma  . Anxiety   . Endometriosis   . Hyperlipidemia   History of thyroid cancer in 1977.  She underwent surgery at [facility] in [place], [place].  She apparently had a tumor in her thyroid with 5 involved lymph nodes.  She did have what sounds like external beam radiation therapy.  We do not have any records regarding the thyroid cancer.   PAST SURGICAL HISTORY: Past Surgical History  Procedure Laterality Date  . Abdominal hysterectomy      TAH - BSO  . Total thyroidectomy  [place]  . Breast surgery      Lumpectomy (06/2006)  Bilateral mastectomy (08/2006)  . Endometrial  ablation       Ablation x 3  . Breast reconstruction with placement of tissue expander and flex hd (acellular hydrated dermis)    . Laminectomy  2007    L4-L5 Laminectomy  . Tonsillectomy and adenoidectomy      Age 9  In 2001, she underwent hysterectomy with bilateral oophorectomy for what sounds like secondary to endometriosis and complications of bleeding.  She was found also to have fibroid tumor. She has also had laser surgery for endometriosis a number of times.  In March 2007, she had a microdiskectomy of the lumbar spine.       FAMILY HISTORY Family History  Problem  Relation Age of Onset  . Tuberculosis Mother   . Tuberculosis Father   . Hypertension Father   . Cancer Father     Prostate cancer  . Cancer Maternal Grandmother     Breast cancer  . Dementia Maternal Grandmother   . Diabetes Brother   Both parents have had tuberculosis and actually met in a sanitarium.  She has a maternal grandmother who had breast cancer in  her late 55 or early nineties.  She has two brothers and one sister.  One lives in [place] and two live in [place] state. Her parents currently live in [place].   There is no other history of breast or ovarian cancer in the family. BRCA testing negative.  GYNECOLOGIC HISTORY: Gravida 1 para 2 (a set identical twins), menses at age 50, age of parity 69, hysterectomy at age 74, use birth control pills from ages 68 through 53, used estradiol hormone replacement therapy for 6-7 years after hysterectomy.  SOCIAL HISTORY: Mr. and Mrs. Puccio has been married since 2.  They have twin 55 year-old boys.  They have one grandchild on the way.  They have lived in [place] since 2006. Her husband is the Psychologist, forensic, Clinical biochemist.  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  She currently volunteers at the surgical waiting area, but has previously been a volunteer at the front desk of the [facility] breast cancer center.  In her spare time she enjoys working out, studying to speaks [place], cooking and reading.   ADVANCED DIRECTIVES: Not on file   HEALTH MAINTENANCE: History  Substance Use Topics  . Smoking status: Former Research scientist (life sciences)  . Smokeless tobacco: Never Used     Comment: Smoked cigaretts from ages [age]  . Alcohol Use: 4.2 oz/week    7 Glasses of wine per week    Colonoscopy: 3-4 years ago where 2 polyps were removed and I will refer for another with Dr Collene Mares PAP: Reportedly negative 1 year ago. Bone density: The patient's last bone density scan on 12/28/2010 showed  a T score of -0.4 (normal). Lipid panel: Not on file  No Known Allergies  Current Outpatient Prescriptions  Medication Sig Dispense Refill  . ALPRAZolam (XANAX) 0.5 MG tablet Take 0.5 mg by mouth as needed for sleep.      . cholecalciferol (VITAMIN D) 1000 UNITS tablet Take 1,000 Units by mouth daily.      . Estradiol (VAGIFEM) 10 MCG TABS vaginal tablet Place 1 tablet (10 mcg total) vaginally 3 (three) times a week.  12 tablet  12  . levothyroxine (SYNTHROID, LEVOTHROID) 175 MCG tablet Take 175 mcg by mouth every morning.       Marland Kitchen RELPAX 40 MG tablet Take 40 mg by mouth as needed.       Marland Kitchen  rosuvastatin (CRESTOR) 10 MG tablet Take 10 mg by mouth daily.      . tamoxifen (NOLVADEX) 20 MG tablet TAKE 1 TABLET EVERY DAY  30 tablet  7   No current facility-administered medications for this visit.    OBJECTIVE: Filed Vitals:   09/05/13 0911  BP: 114/60  Pulse: 62  Temp: 97.8 F (36.6 C)  Resp: 18     Body mass index is 21.99 kg/(m^2).      ECOG FS: 1 - Symptomatic but completely ambulatory  General appearance: Alert, cooperative, well nourished, no apparent distress Head: Normocephalic, without obvious abnormality, atraumatic Eyes: Conjunctivae/corneas clear, PERRLA, EOMI Nose: Nares, septum and mucosa are normal, no drainage or sinus tenderness Neck: No adenopathy, supple, symmetrical, trachea midline, thyroid area scar, no tenderness Resp: Clear to auscultation bilaterally Cardio: Regular rate and rhythm, S1, S2 normal, no murmur, click, rub or gallop Breasts: Reconstructed bilaterally, bilateral breast have well-healed surgical scars, no lymphadenopathy, nipple tattoos, no axilla fullness GI: Soft, not distended, non-tender, hypoactive bowel sounds, no organomegaly Extremities: Extremities normal, atraumatic, no cyanosis or edema Lymph nodes: Cervical, supraclavicular, and axillary nodes normal Neurologic: Grossly normal    LAB RESULTS:          STUDIES:  1.  The  patient's last bone density scan on 12/28/2010 showed a T score of -0.4 (normal).   ASSESSMENT: 53 y.o. woman:  1.  The patient has a history of thyroid cancer and underwent thyroidectomy surgery in 1977 at [facility] in [place], [place].  She had a tumor in her thyroid with 5 involved lymph nodes.  She completed external beam radiation therapy/radioactive iodine ablation.    2.  The patient had a right breast needle core biopsy on 11/07/2005 which showed features highly suspicious for lymphovascular space involvement by tumor, atypical ductal hyperplasia and hyalinized fibrosis.  3.  The patient had a bilateral breast MRI on 11/16/2005 which showed a dense enhancing pattern, bilaterally.  In the right subareolar region, there was a solitary obscured enhancing mass. The dimensions are difficult to give secondary to the patient's bilateral dense enhancing pattern.  It measured approximately 3.3 x 1.7 x 1.8 cm. in transverse, craniocaudal and anterior posterior dimensions. There were no abnormal enhancements in the left breast. No enlarged axillary adenopathy is detected (clinical stage IIA, T2 N0).    4.  The patient had a left breast needle core biopsy at the 11-12 o'clock position on 11/29/2005 which showed invasive mammary carcinoma, estrogen receptor 100% positive, progesterone receptor 50% positive, Ki-67 15%, HER-2/neu 2+ equivocal, HER-2/neu by FISH showed no amplification with a ratio of 1.1.  5.  The patient became a participant in a research study NSABP B-40 in 11/2005.  6.  The patient had neoadjuvant chemotherapy in accordance with research study protocol NSABP B-40 consisting of Taxotere/Gemzar/Avastin from 12/21/2005 through 03/08/2006 x 4 cycles with Neupogen support.  This was followed by neoadjuvant chemotherapy per B-40 research protocol consisting of Adriamycin/Cytoxan/Avastin from 03/22/2006 through 05/24/2006 x 4 cycles with Neulasta support.  7.  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   The tumor was located immediately subjacent to the nipple and the nipple/superficial margin was involved. She has positive surgical margin that required  reexcision of the nipple-areola complex.  8.  Status post right breast simple mastectomy and left breast simple mastectomy on 09/06/2006 which showed in the right breast no residual carcinoma identified, margins negative, no evidence of Paget's disease of the nipple, 2 fibroadenomas, and the left breast showed no carcinoma identified, 0.2 cm fibroadenoma, fibrocystic change associated with microcalcifications, including ductal ectasia, alteration without atypia, adenosis, and fibrosis, margins negative.  Reconstruction completed.  9.  The patient received genetic counseling and testing and comprehensive BRACAnalysis report dated 08/10/2006 showed no mutation detected in the BRCA1 or BRCA2 breast cancer genes.  10.  The patient restarted Avastin therapy in 09/2006 and it was discontinued 10/2006 due to grade 3 myalgias and arthralgias.  Avastin discontinuation ended her participation in NSABP B40 protocol study.  11.  The patient started antiestrogen therapy with Arimidex in 11/2006.   Antiestrogen therapy was changed to Femara in 11/2007.  The patient started antiestrogen therapy with Tamoxifen in 07/2010.  12.  Vaginal dryness/dyspareunia and she uses Vagifem for that.  13. History of colon polyps: She will need another colonoscopy to be set up with Dr Collene Mares. Last one revealed 2 benign polyps and familial history of polyposis as well.   PLAN:   1. The patient will continue antiestrogen therapy with  Tamoxifen, with planned therapy scheduled to continue until 11/2016. We talked about the ATLAS and aTTom trials where 10 year use use of Tamoxifen is proven to be more beneficial than 5 years. Today her physical, breast exam, lymph node exam and her labs were normal.  2. She continue to use Vagifem and I discussed with her again the pros and cons of that as statistically that does increase the risk for breast carcinogenesis. It has been argued that intravaginal estrogen preparations produce low levels of systemic estrogen, and perhaps due to cornification of re-invigorated vaginal epithelium, none at all. Low circulating levels of estrogen can be dangerous. Preclinical studies by Daron Offer and colleagues demonstrated that the growth response to estradiol concentrations of cultured ER-positive human breast cancer cell lines was biphasic , with no growth in media stripped of estrogen, robust growth at relatively low levels, but suppression of growth at higher concentrations. Using high-resolution assays, it has been shown that circulating levels of estrogen become elevated in women using intravaginal estrogen, esp when taking AIs. Our patient is taking Tamoxifen now. In the HABITS trial, women taking tamoxifen were randomly assigned ERT (systemic) vs not. This trial was closed early due to a significant higher rate of recurrence in the ERT arm.  The ATAC trial the mass treated that although single-agent anastrozole was more effective than tamoxifen, combining the two was equivalent to the latter alone and inferior to AI therapy alone. Although there are other explanations for this outcome, one of the most plausible is that the estrogenic agonism of tamoxifen becomes detrimental in the setting of AI-induced, very low estrogen levels.   3. Repeat her labs in a year. I do not see any reason to check LDH as it has been normal.   4. I did talk to her about using baby aspirin. It serves as thromboprophylaxis with  concomitanat use of Tamoxifen and also it has a beneficial effect in patients with breast cancer in cutting down recurrence. ("Aspirin Intake and Survival After Breast Cancer" Tori Milks. Hollice Espy al JCO Apr 05, 2008:1467-1472; DOI:10.1200/JCO.2009.22.7918).  5. We also discussed regarding doing exercise and eating healthy and maintaining a good BMI. All of them help boost the immune  system, and Intact Immunosurveillance by controlling the  micrometastatic disease, decreases chances of relapse or recurrence of cancer.  6. Refer to Dr Collene Mares for a screening colonoscopy with her personal and FH of colon polyps.  All questions were answered.  The patient was encouraged to contact us in the interim with any problems, questions or concerns. Time spent with patient: 40 minutes.     Bernadene Bell, MD Medical Hematologist/Oncologist [place] Pager: [phone] Office No: [phone]

## 2013-09-10 ENCOUNTER — Telehealth: Payer: Self-pay | Admitting: Hematology

## 2013-09-10 NOTE — Telephone Encounter (Signed)
, °

## 2013-10-01 ENCOUNTER — Other Ambulatory Visit: Payer: Self-pay | Admitting: *Deleted

## 2013-10-01 DIAGNOSIS — N951 Menopausal and female climacteric states: Secondary | ICD-10-CM

## 2013-10-01 DIAGNOSIS — Z853 Personal history of malignant neoplasm of breast: Secondary | ICD-10-CM

## 2013-10-01 DIAGNOSIS — IMO0002 Reserved for concepts with insufficient information to code with codable children: Secondary | ICD-10-CM

## 2013-10-01 MED ORDER — ESTRADIOL 10 MCG VA TABS: 10.0000 ug | ORAL_TABLET | VAGINAL | Status: DC

## 2013-10-07 ENCOUNTER — Emergency Department (HOSPITAL_COMMUNITY)
Admission: EM | Admit: 2013-10-07 | Discharge: 2013-10-07 | Disposition: A | Discharge status: Home / Self Care | Payer: BC Managed Care – PPO | Attending: Emergency Medicine | Admitting: Emergency Medicine

## 2013-10-07 ENCOUNTER — Encounter (HOSPITAL_COMMUNITY): Payer: Self-pay | Admitting: Emergency Medicine

## 2013-10-07 DIAGNOSIS — S058X9A Other injuries of unspecified eye and orbit, initial encounter: Secondary | ICD-10-CM | POA: Diagnosis present

## 2013-10-07 DIAGNOSIS — Z853 Personal history of malignant neoplasm of breast: Secondary | ICD-10-CM | POA: Insufficient documentation

## 2013-10-07 DIAGNOSIS — IMO0002 Reserved for concepts with insufficient information to code with codable children: Secondary | ICD-10-CM | POA: Diagnosis not present

## 2013-10-07 DIAGNOSIS — Z79899 Other long term (current) drug therapy: Secondary | ICD-10-CM | POA: Diagnosis not present

## 2013-10-07 DIAGNOSIS — Z8742 Personal history of other diseases of the female genital tract: Secondary | ICD-10-CM | POA: Insufficient documentation

## 2013-10-07 DIAGNOSIS — Y9289 Other specified places as the place of occurrence of the external cause: Secondary | ICD-10-CM | POA: Insufficient documentation

## 2013-10-07 DIAGNOSIS — G43909 Migraine, unspecified, not intractable, without status migrainosus: Secondary | ICD-10-CM | POA: Diagnosis not present

## 2013-10-07 DIAGNOSIS — Y9389 Activity, other specified: Secondary | ICD-10-CM | POA: Diagnosis not present

## 2013-10-07 DIAGNOSIS — F411 Generalized anxiety disorder: Secondary | ICD-10-CM | POA: Diagnosis not present

## 2013-10-07 DIAGNOSIS — Z85850 Personal history of malignant neoplasm of thyroid: Secondary | ICD-10-CM | POA: Insufficient documentation

## 2013-10-07 DIAGNOSIS — Z87891 Personal history of nicotine dependence: Secondary | ICD-10-CM | POA: Diagnosis not present

## 2013-10-07 DIAGNOSIS — E785 Hyperlipidemia, unspecified: Secondary | ICD-10-CM | POA: Insufficient documentation

## 2013-10-07 DIAGNOSIS — S0010XA Contusion of unspecified eyelid and periocular area, initial encounter: Secondary | ICD-10-CM | POA: Diagnosis not present

## 2013-10-07 DIAGNOSIS — S0181XA Laceration without foreign body of other part of head, initial encounter: Secondary | ICD-10-CM

## 2013-10-07 MED ORDER — LIDOCAINE HCL 1 % IJ SOLN
5.0000 mL | Freq: Once | INTRAMUSCULAR | Status: AC
  Administered 2013-10-07: 5 mL

## 2013-10-07 MED ORDER — WHITE PETROLATUM GEL
Status: DC | PRN
  Administered 2013-10-07: 1 via TOPICAL
  Filled 2013-10-07: qty 5

## 2013-10-07 MED ORDER — LIDOCAINE HCL 1 % IJ SOLN
INTRAMUSCULAR | Status: AC
  Filled 2013-10-07: qty 20

## 2013-10-07 NOTE — ED Notes (Signed)
NP at bedside.

## 2013-10-07 NOTE — ED Provider Notes (Signed)
CSN: [id number]     Arrival date & time 10/07/13  1800 History   First MD Initiated Contact with Patient 10/07/13 1958     Chief Complaint  Patient presents with  . eyelid laceration      (Consider location/radiation/quality/duration/timing/severity/associated sxs/prior Treatment) HPI Comments: Patient accidentally hit herself on the the Left eye lid with a weight about 1 PM  Tried repairing it herself with liquid bandage tetanus UTD  The history is provided by the patient.    Past Medical History  Diagnosis Date  . Hx of thyroid cancer 02/16/2011  . Thyroid disease 1977    Thyroid cancer  . Migraine   . Breast cancer, right breast 02/16/2011    Invasive ductal carcinoma  . Anxiety   . Endometriosis   . Hyperlipidemia    Past Surgical History  Procedure Laterality Date  . Abdominal hysterectomy      TAH - BSO  . Total thyroidectomy  [facility]lie  . Breast surgery      Lumpectomy (06/2006)  Bilateral mastectomy (08/2006)  . Endometrial ablation       Ablation x 3  . Breast reconstruction with placement of tissue expander and flex hd (acellular hydrated dermis)    . Laminectomy  2007    L4-L5 Laminectomy  . Tonsillectomy and adenoidectomy      Age 53   Family History  Problem Relation Age of Onset  . Tuberculosis Mother   . Tuberculosis Father   . Hypertension Father   . Cancer Father     Prostate cancer  . Cancer Maternal Grandmother     Breast cancer  . Dementia Maternal Grandmother   . Diabetes Brother    History  Substance Use Topics  . Smoking status: Former Research scientist (life sciences)  . Smokeless tobacco: Never Used     Comment: Smoked cigaretts from ages [age]  . Alcohol Use: 4.2 oz/week    7 Glasses of wine per week   OB History   Grav Para Term Preterm Abortions TAB SAB Ect Mult Living                 Review of Systems  Constitutional: Negative for fever.  Eyes: Negative for photophobia, redness and visual disturbance.  Skin: Positive for  wound.  Neurological: Negative for dizziness and headaches.  All other systems reviewed and are negative.     Allergies  Review of patient's allergies indicates no known allergies.  Home Medications   Prior to Admission medications   Medication Sig Start Date End Date Taking? Authorizing Provider  Ascorbic Acid (VITAMIN C PO) Take 1 tablet by mouth daily.   Yes Historical Provider, MD  cholecalciferol (VITAMIN D) 1000 UNITS tablet Take 1,000 Units by mouth daily.   Yes Historical Provider, MD  escitalopram (LEXAPRO) 10 MG tablet Take 10 mg by mouth daily.  09/21/13  Yes Historical Provider, MD  Estradiol (VAGIFEM) 10 MCG TABS vaginal tablet Place 1 tablet (10 mcg total) vaginally 3 (three) times a week. 10/01/13  Yes Chauncey Cruel, MD  ibuprofen (ADVIL,MOTRIN) 200 MG tablet Take 400 mg by mouth every 6 (six) hours as needed for moderate pain.   Yes Historical Provider, MD  levothyroxine (SYNTHROID, LEVOTHROID) 175 MCG tablet Take 175 mcg by mouth every morning.    Yes Historical Provider, MD  Melatonin CR 3 MG TBCR Take 3 mg by mouth at bedtime as needed (sleep).   Yes Historical Provider, MD  RELPAX 40 MG tablet  Take 40 mg by mouth every 2 (two) hours as needed for migraine.  10/19/11  Yes Historical Provider, MD  rosuvastatin (CRESTOR) 10 MG tablet Take 10 mg by mouth daily.   Yes Historical Provider, MD  tamoxifen (NOLVADEX) 20 MG tablet Take 20 mg by mouth daily.   Yes Historical Provider, MD  VITAMIN K PO Take 1 tablet by mouth daily.   Yes Historical Provider, MD   BP 110/81  Pulse 65  Temp(Src) 99 F (37.2 C) (Oral)  Resp 16  SpO2 100% Physical Exam  Vitals reviewed. Constitutional: She is oriented to person, place, and time. She appears well-developed and well-nourished.  HENT:  Head: Normocephalic.  Eyes: EOM are normal. Pupils are equal, round, and reactive to light.  Neck: Normal range of motion.  Cardiovascular: Normal rate.   Pulmonary/Chest: Effort normal.   Musculoskeletal: Normal range of motion.  Neurological: She is alert and oriented to person, place, and time.  Skin: Skin is warm.  .5cm laceration to upper Left eyelid with minimal bruising    ED Course  LACERATION REPAIR Date/Time: 10/07/2013 8:59 PM Performed by: Garald Balding Authorized by: Garald Balding Consent: Verbal consent obtained. written consent not obtained. Risks and benefits: risks, benefits and alternatives were discussed Consent given by: patient Patient understanding: patient states understanding of the procedure being performed Patient identity confirmed: verbally with patient Body area: head/neck Location details: left eyelid Laceration length: 0.5 cm Foreign bodies: no foreign bodies Tendon involvement: none Nerve involvement: none Vascular damage: no Anesthesia: local infiltration Local anesthetic: lidocaine 1% without epinephrine Anesthetic total: 0.5 ml Patient sedated: no Preparation: Patient was prepped and draped in the usual sterile fashion. Irrigation solution: saline Amount of cleaning: standard Debridement: none Degree of undermining: none Skin closure: 6-0 Prolene Number of sutures: 2 Technique: simple Approximation: close Approximation difficulty: simple Dressing: antibiotic ointment Patient tolerance: Patient tolerated the procedure well with no immediate complications.   (including critical care time) Labs Review Labs Reviewed - No data to display  Imaging Review No results found.   EKG Interpretation None      MDM   Final diagnoses:  Facial laceration, initial encounter     Patient instructed to keep area clean and dry to use antibiotic ointment 1-2 times daily to wash with soap and water daily and have sutures removed in 4-5 days     Garald Balding, NP 10/07/13 2101

## 2013-10-07 NOTE — ED Provider Notes (Signed)
Medical screening examination/treatment/procedure(s) were performed by non-physician practitioner and as supervising physician I was immediately available for consultation/collaboration.   EKG Interpretation None       Cleotilde Spadaccini, MD 10/07/13 2352 

## 2013-10-07 NOTE — ED Notes (Signed)
Patient reports that she hit her left eye with a barbell and cut the left upper eyelid. Patient has liquid bandage on the laceration.

## 2013-10-07 NOTE — Discharge Instructions (Signed)
Facial Laceration A facial laceration is a cut on the face. These injuries can be painful and cause bleeding. Some cuts may need to be closed with stitches (sutures), skin adhesive strips, or wound glue. Cuts usually heal quickly but can leave a scar. It can take 1-2 years for the scar to go away completely. HOME CARE   Only take medicines as told by your doctor.  Follow your doctor's instructions for wound care. For Stitches:  Keep the cut clean and dry.  If you have a bandage (dressing), change it at least once a day. Change the bandage if it gets wet or dirty, or as told by your doctor.  Wash the cut with soap and water 2 times a day. Rinse the cut with water. Pat it dry with a clean towel.  Put a thin layer of medicated cream on the cut as told by your doctor.  You may shower after the first 24 hours. Do not soak the cut in water until the stitches are removed.  Have your stitches removed as told by your doctor.  Do not wear any makeup until a few days after your stitches are removed. For Skin Adhesive Strips:  Keep the cut clean and dry.  Do not get the strips wet. You may take a bath, but be careful to keep the cut dry.  If the cut gets wet, pat it dry with a clean towel.  The strips will fall off on their own. Do not remove the strips that are still stuck to the cut. For Wound Glue:  You may shower or take baths. Do not soak or scrub the cut. Do not swim. Avoid heavy sweating until the glue falls off on its own. After a shower or bath, pat the cut dry with a clean towel.  Do not put medicine or makeup on your cut until the glue falls off.  If you have a bandage, do not put tape over the glue.  Avoid lots of sunlight or tanning lamps until the glue falls off.  The glue will fall off on its own in 5-10 days. Do not pick at the glue. After Healing: Put sunscreen on the cut for the first year to reduce your scar. GET HELP RIGHT AWAY IF:   Your cut area gets red,  painful, or puffy (swollen).  You see a yellowish-white fluid (pus) coming from the cut.  You have chills or a fever. MAKE SURE YOU:   Understand these instructions.  Will watch your condition.  Will get help right away if you are not doing well or get worse. Document Released: 06/22/2007 Document Revised: 10/24/2012 Document Reviewed: 08/16/2012 [facility] Patient Information 2015 [place], [place]. This information is not intended to replace advice given to you by your health care provider. Make sure you discuss any questions you have with your health care provider. Keep the area clean and dry you may apply a small amount of antibiotic ointment 1-2 times daily  The sutures should be removed in 4-5 days

## 2013-10-28 ENCOUNTER — Other Ambulatory Visit: Payer: Self-pay | Admitting: *Deleted

## 2013-10-28 DIAGNOSIS — C50911 Malignant neoplasm of unspecified site of right female breast: Secondary | ICD-10-CM

## 2013-10-28 MED ORDER — TAMOXIFEN CITRATE 20 MG PO TABS: 20.0000 mg | ORAL_TABLET | Freq: Every day | ORAL | Status: DC

## 2014-04-29 ENCOUNTER — Other Ambulatory Visit: Payer: Self-pay | Admitting: *Deleted

## 2014-04-29 DIAGNOSIS — C50911 Malignant neoplasm of unspecified site of right female breast: Secondary | ICD-10-CM

## 2014-04-29 MED ORDER — TAMOXIFEN CITRATE 20 MG PO TABS: 20.0000 mg | ORAL_TABLET | Freq: Every day | ORAL | Status: DC

## 2014-05-06 ENCOUNTER — Other Ambulatory Visit: Payer: Self-pay | Admitting: *Deleted

## 2014-05-06 DIAGNOSIS — Z853 Personal history of malignant neoplasm of breast: Secondary | ICD-10-CM

## 2014-05-06 DIAGNOSIS — N951 Menopausal and female climacteric states: Secondary | ICD-10-CM

## 2014-05-06 DIAGNOSIS — IMO0002 Reserved for concepts with insufficient information to code with codable children: Secondary | ICD-10-CM

## 2014-05-06 MED ORDER — ESTRADIOL 10 MCG VA TABS: 10.0000 ug | ORAL_TABLET | VAGINAL | Status: DC

## 2014-07-11 ENCOUNTER — Other Ambulatory Visit (HOSPITAL_BASED_OUTPATIENT_CLINIC_OR_DEPARTMENT_OTHER): Payer: Self-pay | Admitting: Neurosurgery

## 2014-07-11 ENCOUNTER — Ambulatory Visit (HOSPITAL_BASED_OUTPATIENT_CLINIC_OR_DEPARTMENT_OTHER)
Admission: RE | Admit: 2014-07-11 | Discharge: 2014-07-11 | Disposition: A | Discharge status: Home / Self Care | Payer: BLUE CROSS/BLUE SHIELD | Source: Ambulatory Visit | Attending: Neurosurgery | Admitting: Neurosurgery

## 2014-07-11 DIAGNOSIS — M542 Cervicalgia: Secondary | ICD-10-CM | POA: Diagnosis present

## 2014-07-11 DIAGNOSIS — M5023 Other cervical disc displacement, cervicothoracic region: Secondary | ICD-10-CM

## 2014-07-11 DIAGNOSIS — M47892 Other spondylosis, cervical region: Secondary | ICD-10-CM | POA: Diagnosis not present

## 2014-07-11 DIAGNOSIS — M5082 Other cervical disc disorders, mid-cervical region: Secondary | ICD-10-CM | POA: Insufficient documentation

## 2014-09-11 ENCOUNTER — Telehealth: Payer: Self-pay | Admitting: Oncology

## 2014-09-11 ENCOUNTER — Ambulatory Visit (HOSPITAL_BASED_OUTPATIENT_CLINIC_OR_DEPARTMENT_OTHER): Payer: BLUE CROSS/BLUE SHIELD | Admitting: Oncology

## 2014-09-11 ENCOUNTER — Other Ambulatory Visit (HOSPITAL_BASED_OUTPATIENT_CLINIC_OR_DEPARTMENT_OTHER): Payer: BLUE CROSS/BLUE SHIELD

## 2014-09-11 ENCOUNTER — Other Ambulatory Visit: Payer: Self-pay | Admitting: Oncology

## 2014-09-11 VITALS — BP 114/74 | HR 64 | Temp 97.7°F | Resp 18 | Ht 68.0 in | Wt 146.5 lb

## 2014-09-11 DIAGNOSIS — C50919 Malignant neoplasm of unspecified site of unspecified female breast: Secondary | ICD-10-CM

## 2014-09-11 DIAGNOSIS — Z853 Personal history of malignant neoplasm of breast: Secondary | ICD-10-CM

## 2014-09-11 DIAGNOSIS — C50911 Malignant neoplasm of unspecified site of right female breast: Secondary | ICD-10-CM

## 2014-09-11 DIAGNOSIS — N952 Postmenopausal atrophic vaginitis: Secondary | ICD-10-CM | POA: Diagnosis not present

## 2014-09-11 LAB — CBC WITH DIFFERENTIAL/PLATELET
BASO%: 0.9 % (ref 0.0–2.0)
Basophils Absolute: 0 10*3/uL (ref 0.0–0.1)
EOS%: 1.6 % (ref 0.0–7.0)
Eosinophils Absolute: 0.1 10*3/uL (ref 0.0–0.5)
HCT: 39.9 % (ref 34.8–46.6)
HEMOGLOBIN: 13.6 g/dL (ref 11.6–15.9)
LYMPH%: 37.3 % (ref 14.0–49.7)
MCH: 30.7 pg (ref 25.1–34.0)
MCHC: 34.1 g/dL (ref 31.5–36.0)
MCV: 90.1 fL (ref 79.5–101.0)
MONO#: 0.4 10*3/uL (ref 0.1–0.9)
MONO%: 8.1 % (ref 0.0–14.0)
NEUT#: 2.7 10*3/uL (ref 1.5–6.5)
NEUT%: 52.1 % (ref 38.4–76.8)
Platelets: 237 10*3/uL (ref 145–400)
RBC: 4.43 10*6/uL (ref 3.70–5.45)
RDW: 13.1 % (ref 11.2–14.5)
WBC: 5.2 10*3/uL (ref 3.9–10.3)
lymph#: 1.9 10*3/uL (ref 0.9–3.3)

## 2014-09-11 LAB — COMPREHENSIVE METABOLIC PANEL (CC13)
ALBUMIN: 4.2 g/dL (ref 3.5–5.0)
ALK PHOS: 60 U/L (ref 40–150)
ALT: 23 U/L (ref 0–55)
AST: 27 U/L (ref 5–34)
Anion Gap: 7 mEq/L (ref 3–11)
BUN: 13.8 mg/dL (ref 7.0–26.0)
CO2: 23 mEq/L (ref 22–29)
CREATININE: 0.9 mg/dL (ref 0.6–1.1)
Calcium: 8.9 mg/dL (ref 8.4–10.4)
Chloride: 110 mEq/L — ABNORMAL HIGH (ref 98–109)
EGFR: 76 mL/min/{1.73_m2} — ABNORMAL LOW (ref >90)
GLUCOSE: 91 mg/dL (ref 70–140)
POTASSIUM: 4.4 meq/L (ref 3.5–5.1)
SODIUM: 139 meq/L (ref 136–145)
Total Bilirubin: 0.44 mg/dL (ref 0.20–1.20)
Total Protein: 7 g/dL (ref 6.4–8.3)

## 2014-09-11 MED ORDER — TAMOXIFEN CITRATE 20 MG PO TABS: 20.0000 mg | ORAL_TABLET | Freq: Every day | ORAL | Status: DC

## 2014-09-11 NOTE — Telephone Encounter (Signed)
Gave adn printed appt sched and avs fo rpt for Aug 2017 °
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
## 2014-09-12 LAB — VITAMIN D 25 HYDROXY (VIT D DEFICIENCY, FRACTURES): Vit D, 25-Hydroxy: 36 ng/mL (ref 30–100)

## 2015-04-23 ENCOUNTER — Telehealth: Payer: Self-pay | Admitting: Oncology

## 2015-04-23 NOTE — Telephone Encounter (Signed)
Lvm advising appt time chg on 8/23 from 10am to 12.30p due to [facility]. Pt scheduled to see GM due to HB upcoming departure.

## 2015-07-26 ENCOUNTER — Other Ambulatory Visit: Payer: Self-pay | Admitting: Oncology

## 2015-09-08 ENCOUNTER — Other Ambulatory Visit: Payer: Self-pay | Admitting: *Deleted

## 2015-09-08 DIAGNOSIS — C50911 Malignant neoplasm of unspecified site of right female breast: Secondary | ICD-10-CM

## 2015-09-09 ENCOUNTER — Ambulatory Visit (HOSPITAL_BASED_OUTPATIENT_CLINIC_OR_DEPARTMENT_OTHER): Payer: BLUE CROSS/BLUE SHIELD | Admitting: Oncology

## 2015-09-09 ENCOUNTER — Ambulatory Visit: Payer: BLUE CROSS/BLUE SHIELD | Admitting: Nurse Practitioner

## 2015-09-09 ENCOUNTER — Other Ambulatory Visit (HOSPITAL_BASED_OUTPATIENT_CLINIC_OR_DEPARTMENT_OTHER): Payer: BLUE CROSS/BLUE SHIELD

## 2015-09-09 ENCOUNTER — Other Ambulatory Visit: Payer: BLUE CROSS/BLUE SHIELD

## 2015-09-09 VITALS — BP 111/78 | HR 69 | Temp 98.8°F | Resp 18 | Ht 68.0 in | Wt 150.0 lb

## 2015-09-09 DIAGNOSIS — N941 Unspecified dyspareunia: Secondary | ICD-10-CM | POA: Diagnosis not present

## 2015-09-09 DIAGNOSIS — C50911 Malignant neoplasm of unspecified site of right female breast: Secondary | ICD-10-CM | POA: Diagnosis not present

## 2015-09-09 DIAGNOSIS — Z85850 Personal history of malignant neoplasm of thyroid: Secondary | ICD-10-CM

## 2015-09-09 DIAGNOSIS — N899 Noninflammatory disorder of vagina, unspecified: Secondary | ICD-10-CM

## 2015-09-09 DIAGNOSIS — K635 Polyp of colon: Secondary | ICD-10-CM

## 2015-09-09 DIAGNOSIS — C50212 Malignant neoplasm of upper-inner quadrant of left female breast: Secondary | ICD-10-CM | POA: Diagnosis not present

## 2015-09-09 LAB — CBC WITH DIFFERENTIAL/PLATELET
BASO%: 0.5 % (ref 0.0–2.0)
BASOS ABS: 0 10*3/uL (ref 0.0–0.1)
EOS ABS: 0.1 10*3/uL (ref 0.0–0.5)
EOS%: 1.5 % (ref 0.0–7.0)
HCT: 35.5 % (ref 34.8–46.6)
HEMOGLOBIN: 12.2 g/dL (ref 11.6–15.9)
LYMPH%: 40.1 % (ref 14.0–49.7)
MCH: 30 pg (ref 25.1–34.0)
MCHC: 34.4 g/dL (ref 31.5–36.0)
MCV: 87.4 fL (ref 79.5–101.0)
MONO#: 0.5 10*3/uL (ref 0.1–0.9)
MONO%: 7.8 % (ref 0.0–14.0)
NEUT#: 3 10*3/uL (ref 1.5–6.5)
NEUT%: 50.1 % (ref 38.4–76.8)
Platelets: 244 10*3/uL (ref 145–400)
RBC: 4.06 10*6/uL (ref 3.70–5.45)
RDW: 12.9 % (ref 11.2–14.5)
WBC: 5.9 10*3/uL (ref 3.9–10.3)
lymph#: 2.4 10*3/uL (ref 0.9–3.3)

## 2015-09-09 LAB — COMPREHENSIVE METABOLIC PANEL
ALBUMIN: 3.9 g/dL (ref 3.5–5.0)
ALK PHOS: 58 U/L (ref 40–150)
ALT: 17 U/L (ref 0–55)
ANION GAP: 7 meq/L (ref 3–11)
AST: 26 U/L (ref 5–34)
BUN: 18.6 mg/dL (ref 7.0–26.0)
CO2: 24 mEq/L (ref 22–29)
Calcium: 9.2 mg/dL (ref 8.4–10.4)
Chloride: 107 mEq/L (ref 98–109)
Creatinine: 0.9 mg/dL (ref 0.6–1.1)
EGFR: 71 mL/min/{1.73_m2} — AB (ref >90)
GLUCOSE: 77 mg/dL (ref 70–140)
POTASSIUM: 3.8 meq/L (ref 3.5–5.1)
SODIUM: 138 meq/L (ref 136–145)
Total Bilirubin: 0.57 mg/dL (ref 0.20–1.20)
Total Protein: 7 g/dL (ref 6.4–8.3)

## 2015-09-09 MED ORDER — TAMOXIFEN CITRATE 20 MG PO TABS: 20.0000 mg | ORAL_TABLET | Freq: Every day | ORAL | 10 refills | Status: DC
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
## 2015-09-15 ENCOUNTER — Other Ambulatory Visit: Payer: Self-pay | Admitting: Oncology

## 2016-03-26 ENCOUNTER — Ambulatory Visit (HOSPITAL_COMMUNITY)
Admission: EM | Admit: 2016-03-26 | Discharge: 2016-03-26 | Disposition: A | Discharge status: Home / Self Care | Payer: BLUE CROSS/BLUE SHIELD | Attending: Internal Medicine | Admitting: Internal Medicine

## 2016-03-26 ENCOUNTER — Encounter (HOSPITAL_COMMUNITY): Payer: Self-pay | Admitting: Emergency Medicine

## 2016-03-26 DIAGNOSIS — Z23 Encounter for immunization: Secondary | ICD-10-CM

## 2016-03-26 DIAGNOSIS — S61217A Laceration without foreign body of left little finger without damage to nail, initial encounter: Secondary | ICD-10-CM

## 2016-03-26 MED ORDER — TETANUS-DIPHTH-ACELL PERTUSSIS 5-2.5-18.5 LF-MCG/0.5 IM SUSP
0.5000 mL | Freq: Once | INTRAMUSCULAR | Status: AC
  Administered 2016-03-26: 0.5 mL via INTRAMUSCULAR

## 2016-03-26 MED ORDER — TETANUS-DIPHTH-ACELL PERTUSSIS 5-2.5-18.5 LF-MCG/0.5 IM SUSP
INTRAMUSCULAR | Status: AC
  Filled 2016-03-26: qty 0.5

## 2016-03-26 NOTE — ED Triage Notes (Signed)
Pt sliced her right pinky finger on a grater about an hour ago.

## 2016-03-26 NOTE — ED Provider Notes (Signed)
CSN: [id number]     Arrival date & time 03/26/16  1947 History   None    Chief Complaint  Patient presents with  . Laceration   (Consider location/radiation/quality/duration/timing/severity/associated sxs/prior Treatment) 55y.o. female presents with injury to the pinkie finger on her right hand. Patient states that she injured her hand while using the box grater, laceration is approximately 5 cm in length, approximated and not actively bleeding at time of evaluaiton. Patient has full rom in extremity, cap refill < 2. Last tetanus unknown.        Past Medical History:  Diagnosis Date  . Anxiety   . Breast cancer, right breast ([place]) 02/16/2011   Invasive ductal carcinoma  . Endometriosis   . Hx of thyroid cancer 02/16/2011  . Hyperlipidemia   . Migraine   . Thyroid disease 1977   Thyroid cancer   Past Surgical History:  Procedure Laterality Date  . ABDOMINAL HYSTERECTOMY     TAH - BSO  . BREAST RECONSTRUCTION WITH PLACEMENT OF TISSUE EXPANDER AND FLEX HD (ACELLULAR HYDRATED DERMIS)    . BREAST SURGERY     Lumpectomy (06/2006)  Bilateral mastectomy (08/2006)  . ENDOMETRIAL ABLATION      Ablation x 3  . LAMINECTOMY  2007   L4-L5 Laminectomy  . TONSILLECTOMY AND ADENOIDECTOMY     Age 11  . TOTAL THYROIDECTOMY  [place]   Family History  Problem Relation Age of Onset  . Tuberculosis Mother   . Tuberculosis Father   . Hypertension Father   . Cancer Father     Prostate cancer  . Cancer Maternal Grandmother     Breast cancer  . Dementia Maternal Grandmother   . Diabetes Brother    Social History  Substance Use Topics  . Smoking status: Former Research scientist (life sciences)  . Smokeless tobacco: Never Used     Comment: Smoked cigaretts from ages [age]  . Alcohol use 4.2 oz/week    7 Glasses of wine per week   OB History    No data available     Review of Systems  Constitutional: Negative for chills and fever.  HENT: Negative for ear pain and sore throat.   Eyes:  Negative for pain and visual disturbance.  Respiratory: Negative for cough and shortness of breath.   Cardiovascular: Negative for chest pain and palpitations.  Gastrointestinal: Negative for abdominal pain and vomiting.  Genitourinary: Negative for dysuria and hematuria.  Musculoskeletal: Negative for arthralgias and back pain.  Skin: Positive for wound (to medial side of pinkie finger). Negative for color change and rash.  Neurological: Negative for seizures and syncope.  All other systems reviewed and are negative.   Allergies  Patient has no known allergies.  Home Medications   Prior to Admission medications   Medication Sig Start Date End Date Taking? Authorizing Provider  cholecalciferol (VITAMIN D) 1000 UNITS tablet Take 1,000 Units by mouth daily.   Yes Historical Provider, MD  escitalopram (LEXAPRO) 5 MG tablet Take 5 mg by mouth daily.   Yes Historical Provider, MD  levothyroxine (SYNTHROID, LEVOTHROID) 175 MCG tablet Take 150 mcg by mouth every morning.   Yes Historical Provider, MD  linaclotide (LINZESS) 145 MCG CAPS capsule Take 145 mcg by mouth daily before breakfast.   Yes Historical Provider, MD  RELPAX 40 MG tablet Take 40 mg by mouth every 2 (two) hours as needed for migraine.  10/19/11  Yes Historical Provider, MD  rosuvastatin (CRESTOR) 10 MG tablet Take 10 mg  by mouth daily.   Yes Historical Provider, MD  tamoxifen (NOLVADEX) 20 MG tablet Take 1 tablet (20 mg total) by mouth daily. 09/09/15  Yes Chauncey Cruel, MD  Estradiol (VAGIFEM) 10 MCG TABS vaginal tablet Place 1 tablet (10 mcg total) vaginally 3 (three) times a week. 05/06/14   Chauncey Cruel, MD  ibuprofen (ADVIL,MOTRIN) 200 MG tablet Take 400 mg by mouth every 6 (six) hours as needed for moderate pain.    Historical Provider, MD  Melatonin CR 3 MG TBCR Take 3 mg by mouth at bedtime as needed (sleep).    Historical Provider, MD  YUVAFEM 10 MCG TABS vaginal tablet INSERT 1 TABLET VAGINALLY 3 TIMES A WEEK  09/15/15   Chauncey Cruel, MD   Meds Ordered and Administered this Visit   Medications  Tdap (BOOSTRIX) injection 0.5 mL (0.5 mLs Intramuscular Given 03/26/16 2009)    BP 141/74 (BP Location: Left Arm)   Pulse 72   Temp 98.9 F (37.2 C) (Oral)   SpO2 100%  No data found.   Physical Exam  Constitutional: She is oriented to person, place, and time. She appears well-developed and well-nourished.  HENT:  Head: Normocephalic and atraumatic.  Eyes: Conjunctivae are normal.  Neck: Normal range of motion.  Pulmonary/Chest: Effort normal.  Neurological: She is alert and oriented to person, place, and time.  Skin: Capillary refill takes less than 2 seconds.  Grade 2 Laceration approximately 5 cm to medial aspect of right pinky finger. No bleeding. Would well approximated.    Psychiatric: She has a normal mood and affect.  Nursing note and vitals reviewed.   Urgent Care Course     .Marland KitchenLaceration Repair Date/Time: 03/26/2016 8:11 PM Performed by: Jacqualine Mau Authorized by: Sherlene Shams   Consent:    Consent obtained:  Verbal   Consent given by:  Patient   Risks discussed:  Infection, pain and retained foreign body   Alternatives discussed:  No treatment Anesthesia (see MAR for exact dosages):    Anesthesia method:  None Laceration details:    Location:  Finger   Finger location:  R small finger   Length (cm):  5 Repair type:    Repair type:  Simple Pre-procedure details:    Preparation:  Patient was prepped and draped in usual sterile fashion Treatment:    Area cleansed with:  Saline   Amount of cleaning:  Standard   Irrigation solution:  Sterile saline   Irrigation method:  Syringe   Visualized foreign bodies/material removed: no   Skin repair:    Repair method:  Tissue adhesive Approximation:    Approximation:  Close   Vermilion border: well-aligned   Post-procedure details:    Dressing:  Open (no dressing)   (including critical care time)  Labs  Review Labs Reviewed - No data to display  Imaging Review No results found.   Visual Acuity Review      MDM   1. Laceration of left little finger without foreign body without damage to nail, initial encounter       Jacqualine Mau, NP 03/26/16 2012

## 2016-07-31 ENCOUNTER — Telehealth: Payer: Self-pay

## 2016-07-31 NOTE — Telephone Encounter (Signed)
Called patient with new appts as dr gm out of office and her vm was full,a calendar has been mailed to the patient

## 2016-09-26 ENCOUNTER — Other Ambulatory Visit (HOSPITAL_BASED_OUTPATIENT_CLINIC_OR_DEPARTMENT_OTHER): Payer: Self-pay

## 2016-09-26 DIAGNOSIS — C50212 Malignant neoplasm of upper-inner quadrant of left female breast: Secondary | ICD-10-CM

## 2016-09-26 DIAGNOSIS — C50911 Malignant neoplasm of unspecified site of right female breast: Secondary | ICD-10-CM

## 2016-09-26 LAB — COMPREHENSIVE METABOLIC PANEL
ALBUMIN: 4 g/dL (ref 3.5–5.0)
ALK PHOS: 70 U/L (ref 40–150)
ALT: 19 U/L (ref 0–55)
ANION GAP: 10 meq/L (ref 3–11)
AST: 24 U/L (ref 5–34)
BILIRUBIN TOTAL: 0.4 mg/dL (ref 0.20–1.20)
BUN: 13.7 mg/dL (ref 7.0–26.0)
CALCIUM: 9.3 mg/dL (ref 8.4–10.4)
CO2: 23 mEq/L (ref 22–29)
Chloride: 104 mEq/L (ref 98–109)
Creatinine: 0.9 mg/dL (ref 0.6–1.1)
EGFR: 74 mL/min/{1.73_m2} — AB (ref >90)
Glucose: 89 mg/dl (ref 70–140)
Potassium: 4.2 mEq/L (ref 3.5–5.1)
Sodium: 137 mEq/L (ref 136–145)
TOTAL PROTEIN: 7.3 g/dL (ref 6.4–8.3)

## 2016-10-03 ENCOUNTER — Ambulatory Visit (HOSPITAL_BASED_OUTPATIENT_CLINIC_OR_DEPARTMENT_OTHER): Payer: Self-pay | Admitting: Oncology

## 2016-10-03 ENCOUNTER — Other Ambulatory Visit: Payer: BLUE CROSS/BLUE SHIELD

## 2016-10-03 ENCOUNTER — Telehealth: Payer: Self-pay | Admitting: Oncology

## 2016-10-03 VITALS — BP 107/68 | HR 70 | Temp 98.2°F | Resp 18 | Ht 68.0 in | Wt 146.3 lb

## 2016-10-03 DIAGNOSIS — Z79810 Long term (current) use of selective estrogen receptor modulators (SERMs): Secondary | ICD-10-CM

## 2016-10-03 DIAGNOSIS — Z170 Estrogen receptor positive status [ER+]: Secondary | ICD-10-CM

## 2016-10-03 DIAGNOSIS — C50911 Malignant neoplasm of unspecified site of right female breast: Secondary | ICD-10-CM

## 2016-10-03 NOTE — Telephone Encounter (Signed)
Gave patient avs and calendar with appts.  °

## 2016-10-03 NOTE — Progress Notes (Signed)
[place]  Telephone:([phone] Fax:([phone]  OFFICE PROGRESS NOTE   ID: Lisa Guzman   DOB: 11-Mar-1960  MR#: [id number]  GLO#:[id number]   PCP: Aletha Halim., PA-C SU: Neldon Mc, M.D.[ SU: Erline Hau, M.D. OTHER: Dian Queen MD  REASON FOR OFFICE VISIT: Breast cancer follow up.   BREAST CANCER HISTORY: From the earlier summary note:  This is a 56 years old patient from [place] who was last seen here on 09/05/2012 by Ailene Ards NP and comes for a follow up visit. She also have a known history of thyroid cancer when she was young treated with surgery and RAI. She has undergone routine mammography on an annual basis.  Her last mammogram was in March 2007.  She typically has fairly dense breasts and noted a mass in her right breast.  She was in turn sent for a mammogram that was done at [facility].  A subsequent ultrasound done at [facility] on 11/07/05 showed an irregular mass-like hypoechoic area measuring 2.7 x 2 x 1.5 cm.  The physical exam at that time showed an area of papular nodularity in the upper outer aspect of the breast.  Biopsy took place on 11/07/05, the results of which showed features suspicious for lymphovascular space involvement by tumor.  There are no discrete tumor cells seen, but there were atypical neoplastic epithelial cells see in the lymphovascular space.  Insufficient tissue was available for prognostic panel.  A second biopsy has been done on 11/29/05.  MRI scan of both breasts done on 11/16/05 showed a solitary enhancing mass in the right subareolar area measuring 3.3 x 1.7 x 1.8 cm.  The left breast was negative.  (clinical stage IIA, T2 N0).  Ms. Yeager has since undergone fairly extensive staging evaluation with CT/PET, bone scan.  The CT scan and PET scan showed postsurgical changes in the upper chest secondary to previous thyroid surgery.  Finding of subpleural nodules, right lower lobe.   Subcentimeter low-density lesion, anterior aspect of the left hepatic lobe, suggestive of cysts.  The PET scan done at the same time showed potential findings for micrometastases in the lymph nodes of the right axilla.  The tumor at the subareolar location had an SUV of 2.9.  A baseline 2D echo showed ejection fraction of 55% with normal wall motion.The patient had a right breast needle core biopsy on 11/07/2005 which showed features highly suspicious for lymphovascular space involvement by tumor, atypical ductal hyperplasia and hyalinized fibrosis.The patient had a left breast needle core biopsy at the 11-12 o'clock position on 11/29/2005 which showed invasive mammary carcinoma, estrogen receptor 100% positive, progesterone receptor 50% positive, Ki-67 15%, HER-2/neu 2+ equivocal, HER-2/neu by FISH showed no amplification with a ratio of 1.1.  The patient became a participant in a research study NSABP B-40 in 11/2005. The patient had neoadjuvant chemotherapy in accordance with research study protocol NSABP B-40 consisting of Taxotere/Gemzar/Avastin from 12/21/2005 through 03/08/2006 x 4 cycles with Neupogen support.  This was followed by neoadjuvant chemotherapy per B-40 research protocol consisting of Adriamycin/Cytoxan/Avastin from 03/22/2006 through 05/24/2006 x 4 cycles with Neulasta support. 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   The tumor was located immediately subjacent to  the nipple and the nipple/superficial margin was involved. She has positive surgical margin that required reexcision of the nipple-areola complex. Patient then underwent right breast simple mastectomy and  left breast simple mastectomy on 09/06/2006 which showed in the right breast no residual carcinoma identified, margins negative, no evidence of Paget's disease of the nipple, 2 fibroadenomas, and the left breast showed no carcinoma identified, 0.2 cm fibroadenoma, fibrocystic change associated with microcalcifications, including ductal ectasia, alteration without atypia, adenosis, and fibrosis, margins negative.  Reconstruction completed. The patient received genetic counseling and testing and comprehensive BRACAnalysis report dated 08/10/2006 showed no mutation detected in the BRCA1 or BRCA2 breast cancer genes. The patient restarted Avastin therapy in 09/2006 and it was discontinued 10/2006 due to grade 3 myalgias and arthralgias.  Avastin discontinuation ended her participation in NSABP B40 protocol study. The patient started antiestrogen therapy with Arimidex in 11/2006.   Antiestrogen therapy was changed to Femara in 11/2007.  The patient started antiestrogen therapy with Tamoxifen in 07/2010.  Her subsequent history is as detailed below  INTERVAL HISTORY: Lisa Guzman returns today for follow-up of her remote breast cancer. She continues on tamoxifen, chiefly to allow her to use vaginal estrogens safely. That strategy is working well for her or at least "well enough". She is interested in continuing.   REVIEW OF SYSTEMS: Lisa Guzman states that she remains extremely active by exercising through weight training, cardio, and ambulating with her dog 6 days a week. She denies unusual headaches, visual changes, nausea, vomiting, or dizziness. There has been no unusual cough, phlegm production, or pleurisy. This been no change in bowel or bladder habits. She denies unexplained fatigue or unexplained weight loss, bleeding, rash, or fever. A detailed review of systems was otherwise negative.    PAST MEDICAL HISTORY: Past Medical History:  Diagnosis Date  . Anxiety   . Breast cancer, right breast ([place]) 02/16/2011    Invasive ductal carcinoma  . Endometriosis   . Hx of thyroid cancer 02/16/2011  . Hyperlipidemia   . Migraine   . Thyroid disease 1977   Thyroid cancer  History of thyroid cancer in 1977.  She underwent surgery at [facility] in [place], [place].  She apparently had a tumor in her thyroid with 5 involved lymph nodes.  She did have what sounds like external beam radiation therapy.  We do not have any records regarding the thyroid cancer.   PAST SURGICAL HISTORY: Past Surgical History:  Procedure Laterality Date  . ABDOMINAL HYSTERECTOMY     TAH - BSO  . BREAST RECONSTRUCTION WITH PLACEMENT OF TISSUE EXPANDER AND FLEX HD (ACELLULAR HYDRATED DERMIS)    . BREAST SURGERY     Lumpectomy (06/2006)  Bilateral mastectomy (08/2006)  . ENDOMETRIAL ABLATION      Ablation x 3  . LAMINECTOMY  2007   L4-L5 Laminectomy  . TONSILLECTOMY AND ADENOIDECTOMY     Age 58  . TOTAL THYROIDECTOMY  [place]  In 2001, she underwent hysterectomy with bilateral oophorectomy for what sounds like secondary to endometriosis and complications of bleeding.  She was found also to have fibroid tumor. She has also had laser surgery for endometriosis a number of times.  In March 2007, she had a microdiskectomy of the lumbar spine.       FAMILY HISTORY Family History  Problem Relation Age of Onset  . Tuberculosis Mother   . Tuberculosis Father   . Hypertension Father   . Cancer Father  Prostate cancer  . Cancer Maternal Grandmother        Breast cancer  . Dementia Maternal Grandmother   . Diabetes Brother   Both parents have had tuberculosis and actually met in a sanitarium.  She has a maternal grandmother who had breast cancer in  her late 45 or early nineties.  She has two brothers and one sister.  One lives in [place] and two live in [place] state. Her parents currently live in [place].   There is no other history of breast or ovarian cancer in the  family. BRCA testing negative.  GYNECOLOGIC HISTORY: Gravida 1 para 2 (a set identical twins), menses at age 89, age of parity 11, status post hysterectomy and bilateral salpingo-oophorectomy at age 33, use birth control pills from ages 68 through 1, used estradiol hormone replacement therapy for 6-7 years after hysterectomy.  SOCIAL HISTORY: Mr. and Mrs. Fenn has been married since 51.  They have twin 58 year-old boys.  They have one grandchild on the way.  They have lived in [place] since 2006. Her husband is the Psychologist, forensic, Clinical biochemist.  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  She currently volunteers at the surgical waiting area, but has previously been a volunteer at the front desk of the [facility] breast cancer center.  In her spare time she enjoys working out, studying to speaks [place], cooking and reading.   ADVANCED DIRECTIVES: Not on file   HEALTH MAINTENANCE: Social History  Substance Use Topics  . Smoking status: Former Research scientist (life sciences)  . Smokeless tobacco: Never Used     Comment: Smoked cigaretts from ages [age]  . Alcohol use 4.2 oz/week    7 Glasses of wine per week    Colonoscopy: Dr Collene Mares PAP:  Bone density: The patient's last bone density scan on 12/28/2010 showed a T score of -0.4 (normal). Lipid panel: Not on file  No Known Allergies  Current Outpatient Prescriptions  Medication Sig Dispense Refill  . cholecalciferol (VITAMIN D) 1000 UNITS tablet Take 1,000 Units by mouth daily.    Marland Kitchen escitalopram (LEXAPRO) 5 MG tablet Take 5 mg by mouth daily.    Marland Kitchen ibuprofen (ADVIL,MOTRIN) 200 MG tablet Take 400 mg by mouth every 6 (six) hours as needed for moderate pain.    Marland Kitchen levothyroxine (SYNTHROID, LEVOTHROID) 175 MCG tablet Take 150 mcg by mouth every morning.    . linaclotide (LINZESS) 145 MCG CAPS capsule Take 145 mcg by mouth daily before breakfast.    . Melatonin ER 5 MG TBCR Take 5 mg by mouth at bedtime as needed  (sleep).     . RELPAX 40 MG tablet Take 40 mg by mouth every 2 (two) hours as needed for migraine.     . rosuvastatin (CRESTOR) 10 MG tablet Take 10 mg by mouth daily.    . tamoxifen (NOLVADEX) 20 MG tablet Take 1 tablet (20 mg total) by mouth daily. 90 tablet 10  . YUVAFEM 10 MCG TABS vaginal tablet INSERT 1 TABLET VAGINALLY 3 TIMES A WEEK 12 tablet 10  . zolpidem (AMBIEN) 10 MG tablet Take 10 mg by mouth.     No current facility-administered medications for this visit.     OBJECTIVE: Middle-aged white woman In no acute distress Vitals:   10/03/16 1150  BP: 107/68  Pulse: 70  Resp: 18  Temp: 98.2 F (36.8 C)  SpO2: 100%     Body mass index is 22.24 kg/m.  ECOG FS: 0 - Asymptomatic  Sclerae unicteric, pupils round and equal Oropharynx clear and moist No cervical or supraclavicular adenopathy Lungs no rales or rhonchi Heart regular rate and rhythm Abd soft, nontender, positive bowel sounds MSK no focal spinal tenderness, no upper extremity lymphedema Neuro: nonfocal, well oriented, appropriate affect Breasts: There is post bilateral mastectomies with bilateral implant reconstruction. There is no evidence of chest wall recurrence. Both axillae are benign.    LAB RESULTS: CBC    Component Value Date/Time   WBC 5.9 09/09/2015 1230   WBC 6.2 09/04/2006 0944   RBC 4.06 09/09/2015 1230   RBC 4.27 09/04/2006 0944   HGB 12.2 09/09/2015 1230   HCT 35.5 09/09/2015 1230   PLT 244 09/09/2015 1230   MCV 87.4 09/09/2015 1230   MCH 30.0 09/09/2015 1230   MCH 30.8 01/27/2010 1309   MCHC 34.4 09/09/2015 1230   MCHC 34.3 09/04/2006 0944   RDW 12.9 09/09/2015 1230   LYMPHSABS 2.4 09/09/2015 1230   MONOABS 0.5 09/09/2015 1230   EOSABS 0.1 09/09/2015 1230   BASOSABS 0.0 09/09/2015 1230    CMP Latest Ref Rng & Units 09/26/2016 09/09/2015 09/11/2014  Glucose 70 - 140 mg/dl 89 77 91  BUN 7.0 - 26.0 mg/dL 13.7 18.6 13.8  Creatinine 0.6 - 1.1 mg/dL 0.9 0.9 0.9  Sodium 136 - 145  mEq/L 137 138 139  Potassium 3.5 - 5.1 mEq/L 4.2 3.8 4.4  Chloride 98 - 107 mEq/L - - -  CO2 22 - 29 mEq/L _0 Calcium 8.4 - 10.4 mg/dL 9.3 9.2 8.9  Total Protein 6.4 - 8.3 g/dL 7.3 7.0 7.0  Total Bilirubin 0.20 - 1.20 mg/dL 0.40 0.57 0.44  Alkaline Phos 40 - 150 U/L 70 58 60  AST 5 - 34 U/L _1 ALT 0 - 55 U/L _2 STUDIES: No results found.   ASSESSMENT: 56 y.o. woman:  1.  The patient has a history of thyroid cancer and underwent thyroidectomy surgery in 1977 at [facility] in [place], [place].  She had a tumor in her thyroid with 5 involved lymph nodes.  She completed external beam radiation therapy/radioactive iodine ablation.    2.  The patient had a right breast needle core biopsy on 11/07/2005 which showed features highly suspicious for lymphovascular space involvement by tumor, atypical ductal hyperplasia and hyalinized fibrosis.  3.  The patient had a bilateral breast MRI on 11/16/2005 which showed a dense enhancing pattern, bilaterally.  In the right subareolar region, there was a solitary obscured enhancing mass. The dimensions are difficult to give secondary to the patient's bilateral dense enhancing pattern.  It measured approximately 3.3 x 1.7 x 1.8 cm. in transverse, craniocaudal and anterior posterior dimensions. There were no abnormal enhancements in the left breast. No enlarged axillary adenopathy is detected (clinical stage IIA, T2 N0).    4.  The patient had a left breast needle core biopsy at the 11-12 o'clock position on 11/29/2005 which showed invasive mammary carcinoma, estrogen receptor 100% positive, progesterone receptor 50% positive, Ki-67 15%, HER-2/neu 2+ equivocal, HER-2/neu by FISH showed no amplification with a ratio of 1.1.  5.  The patient became a participant in a research study NSABP B-40 in 11/2005.  6.  The patient had neoadjuvant chemotherapy in accordance with research study protocol NSABP B-40 consisting of  Taxotere/Gemzar/Avastin from 12/21/2005 through 03/08/2006 x 4 cycles with Neupogen support.  This was followed by neoadjuvant chemotherapy  per B-40 research protocol consisting of Adriamycin/Cytoxan/Avastin from 03/22/2006 through 05/24/2006 x 4 cycles with Neulasta support.  7.  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   The tumor was located immediately subjacent to the nipple and the nipple/superficial margin was involved. She has positive surgical margin that required reexcision of the nipple-areola complex.  8.  Status post right breast simple mastectomy and left breast simple mastectomy on 09/06/2006 which showed in the right breast no residual carcinoma identified, margins negative, no evidence of Paget's disease of the nipple, 2 fibroadenomas, and the left breast showed no carcinoma identified, 0.2 cm fibroadenoma, fibrocystic change associated with microcalcifications, including ductal ectasia, alteration without atypia, adenosis, and fibrosis, margins negative.  Reconstruction completed.  9.  The patient received genetic counseling and testing and comprehensive BRACAnalysis report dated 08/10/2006 showed no mutation detected in the BRCA1 or BRCA2 breast cancer genes.  10.  The patient restarted Avastin therapy in 09/2006 and it was discontinued 10/2006 due to grade 3 myalgias and arthralgias.  Avastin discontinuation ended her participation in NSABP B40 protocol study.  11.  The patient started antiestrogen therapy with Arimidex in 11/2006.   Antiestrogen therapy was changed to Femara in 11/2007.  The patient started antiestrogen therapy with Tamoxifen in 07/2010.  12.  Vaginal  dryness/dyspareunia and she uses Vagifem for that.  13. History of colon polyps: She will need another colonoscopy to be set up with Dr Collene Mares. Last one revealed 2 benign polyps and familial history of polyposis as well.   PLAN:  Lisa Guzman is now 10 years out from definitive surgery for her breast cancer with no evidence of disease recurrence. This is excellent.  She wishes to continue on tamoxifen chiefly so she can safely use vaginal estrogens. The main benefit aside from that of course will be with her bones. She understands that as far as breast cancer is concerned she has already derived whatever benefit she was going to derive from that medication  Andria Frames she wishes to continue we will see her on a yearly basis.  She knows to call for any problems that may develop before her next visit here  Lygia Olaes, Virgie Dad, MD  10/03/16 12:12 PM Medical Oncology and Hematology [facility] [place], [place] [address] Tel. [phone]    Fax. [phone]  This document serves as a record of services personally performed by Lurline Del, MD. It was created on her behalf by Steva Colder, a trained medical scribe. The creation of this record is based on the scribe's personal observations and the provider's statements to them. This document has been checked and approved by the attending provider.

## 2016-10-10 ENCOUNTER — Ambulatory Visit: Payer: BLUE CROSS/BLUE SHIELD | Admitting: Oncology

## 2016-10-14 ENCOUNTER — Other Ambulatory Visit: Payer: Self-pay | Admitting: *Deleted

## 2016-10-14 MED ORDER — ESTRADIOL 10 MCG VA TABS: ORAL_TABLET | VAGINAL | 10 refills | Status: DC

## 2016-10-16 ENCOUNTER — Other Ambulatory Visit: Payer: Self-pay | Admitting: Oncology

## 2016-10-17 ENCOUNTER — Other Ambulatory Visit: Payer: Self-pay | Admitting: *Deleted

## 2017-02-11 ENCOUNTER — Other Ambulatory Visit: Payer: Self-pay | Admitting: Neurosurgery

## 2017-02-11 DIAGNOSIS — M5412 Radiculopathy, cervical region: Secondary | ICD-10-CM

## 2017-02-18 ENCOUNTER — Ambulatory Visit
Admission: RE | Admit: 2017-02-18 | Discharge: 2017-02-18 | Disposition: A | Discharge status: Home / Self Care | Payer: BLUE CROSS/BLUE SHIELD | Source: Ambulatory Visit | Attending: Neurosurgery | Admitting: Neurosurgery

## 2017-02-18 DIAGNOSIS — M5412 Radiculopathy, cervical region: Secondary | ICD-10-CM

## 2017-09-25 ENCOUNTER — Telehealth: Payer: Self-pay | Admitting: Oncology

## 2017-09-25 NOTE — Telephone Encounter (Signed)
[facility] PAL - moved 9/17 appointments to 9/27 w/LC. Spoke with patient.

## 2017-10-03 ENCOUNTER — Other Ambulatory Visit: Payer: Self-pay

## 2017-10-03 ENCOUNTER — Ambulatory Visit: Payer: Self-pay | Admitting: Oncology

## 2017-10-10 ENCOUNTER — Other Ambulatory Visit: Payer: Self-pay | Admitting: Oncology

## 2017-10-11 ENCOUNTER — Other Ambulatory Visit: Payer: Self-pay | Admitting: Oncology

## 2017-10-13 ENCOUNTER — Encounter: Payer: Self-pay | Admitting: Adult Health

## 2017-10-13 ENCOUNTER — Inpatient Hospital Stay: Payer: BLUE CROSS/BLUE SHIELD | Attending: Oncology

## 2017-10-13 ENCOUNTER — Inpatient Hospital Stay (HOSPITAL_BASED_OUTPATIENT_CLINIC_OR_DEPARTMENT_OTHER): Payer: BLUE CROSS/BLUE SHIELD | Admitting: Adult Health

## 2017-10-13 ENCOUNTER — Telehealth: Payer: Self-pay | Admitting: Adult Health

## 2017-10-13 VITALS — BP 122/75 | HR 66 | Temp 98.4°F | Resp 19 | Ht 68.0 in | Wt 145.1 lb

## 2017-10-13 DIAGNOSIS — F419 Anxiety disorder, unspecified: Secondary | ICD-10-CM | POA: Insufficient documentation

## 2017-10-13 DIAGNOSIS — Z90710 Acquired absence of both cervix and uterus: Secondary | ICD-10-CM

## 2017-10-13 DIAGNOSIS — Z90722 Acquired absence of ovaries, bilateral: Secondary | ICD-10-CM | POA: Insufficient documentation

## 2017-10-13 DIAGNOSIS — N941 Unspecified dyspareunia: Secondary | ICD-10-CM

## 2017-10-13 DIAGNOSIS — Z170 Estrogen receptor positive status [ER+]: Secondary | ICD-10-CM | POA: Diagnosis not present

## 2017-10-13 DIAGNOSIS — Z86010 Personal history of colonic polyps: Secondary | ICD-10-CM

## 2017-10-13 DIAGNOSIS — Z803 Family history of malignant neoplasm of breast: Secondary | ICD-10-CM | POA: Diagnosis not present

## 2017-10-13 DIAGNOSIS — Z85850 Personal history of malignant neoplasm of thyroid: Secondary | ICD-10-CM | POA: Insufficient documentation

## 2017-10-13 DIAGNOSIS — Z79810 Long term (current) use of selective estrogen receptor modulators (SERMs): Secondary | ICD-10-CM | POA: Diagnosis not present

## 2017-10-13 DIAGNOSIS — C50911 Malignant neoplasm of unspecified site of right female breast: Secondary | ICD-10-CM | POA: Diagnosis present

## 2017-10-13 DIAGNOSIS — R918 Other nonspecific abnormal finding of lung field: Secondary | ICD-10-CM | POA: Insufficient documentation

## 2017-10-13 DIAGNOSIS — Z79899 Other long term (current) drug therapy: Secondary | ICD-10-CM

## 2017-10-13 DIAGNOSIS — Z87891 Personal history of nicotine dependence: Secondary | ICD-10-CM

## 2017-10-13 DIAGNOSIS — R51 Headache: Secondary | ICD-10-CM | POA: Insufficient documentation

## 2017-10-13 DIAGNOSIS — E785 Hyperlipidemia, unspecified: Secondary | ICD-10-CM | POA: Diagnosis not present

## 2017-10-13 DIAGNOSIS — Z8042 Family history of malignant neoplasm of prostate: Secondary | ICD-10-CM | POA: Insufficient documentation

## 2017-10-13 LAB — CBC WITH DIFFERENTIAL/PLATELET
BASOS PCT: 1 %
Basophils Absolute: 0.1 10*3/uL (ref 0.0–0.1)
EOS ABS: 0.3 10*3/uL (ref 0.0–0.5)
EOS PCT: 4 %
HCT: 39.2 % (ref 34.8–46.6)
Hemoglobin: 12.9 g/dL (ref 11.6–15.9)
Lymphocytes Relative: 43 %
Lymphs Abs: 2.8 10*3/uL (ref 0.9–3.3)
MCH: 29.9 pg (ref 25.1–34.0)
MCHC: 32.9 g/dL (ref 31.5–36.0)
MCV: 90.7 fL (ref 79.5–101.0)
MONO ABS: 0.7 10*3/uL (ref 0.1–0.9)
MONOS PCT: 10 %
NEUTROS PCT: 42 %
Neutro Abs: 2.7 10*3/uL (ref 1.5–6.5)
PLATELETS: 244 10*3/uL (ref 145–400)
RBC: 4.32 MIL/uL (ref 3.70–5.45)
RDW: 12.9 % (ref 11.2–14.5)
WBC: 6.5 10*3/uL (ref 3.9–10.3)

## 2017-10-13 LAB — COMPREHENSIVE METABOLIC PANEL
ALBUMIN: 3.9 g/dL (ref 3.5–5.0)
ALK PHOS: 71 U/L (ref 38–126)
ALT: 14 U/L (ref 0–44)
ANION GAP: 8 (ref 5–15)
AST: 20 U/L (ref 15–41)
BILIRUBIN TOTAL: 0.4 mg/dL (ref 0.3–1.2)
BUN: 13 mg/dL (ref 6–20)
CHLORIDE: 107 mmol/L (ref 98–111)
CO2: 25 mmol/L (ref 22–32)
CREATININE: 0.87 mg/dL (ref 0.44–1.00)
Calcium: 8.9 mg/dL (ref 8.9–10.3)
GFR calc non Af Amer: >60 mL/min (ref >60)
Glucose, Bld: 134 mg/dL — ABNORMAL HIGH (ref 70–99)
POTASSIUM: 4.3 mmol/L (ref 3.5–5.1)
Sodium: 140 mmol/L (ref 135–145)
Total Protein: 6.7 g/dL (ref 6.5–8.1)

## 2017-10-13 NOTE — Progress Notes (Signed)
[place]  [phone] [phone]  OFFICE PROGRESS NOTE   ID: Lisa Guzman   DOB: [birth year]  MR#: [id number]  JGG#:[id number]   PCP: Aletha Halim., PA-C SU: Neldon Mc, M.D.[ SU: Erline Hau, M.D. OTHER: Dian Queen MD  REASON FOR OFFICE VISIT: Breast cancer follow up.   BREAST CANCER HISTORY: From the earlier summary note:  This is a 57 years old patient from [place] who was last seen here on 09/05/2012 by Ailene Ards NP and comes for a follow up visit. She also have a known history of thyroid cancer when she was young treated with surgery and RAI. She has undergone routine mammography on an annual basis.  Her last mammogram was in March 2007.  She typically has fairly dense breasts and noted a mass in her right breast.  She was in turn sent for a mammogram that was done at [facility].  A subsequent ultrasound done at [facility] on 11/07/05 showed an irregular mass-like hypoechoic area measuring 2.7 x 2 x 1.5 cm.  The physical exam at that time showed an area of papular nodularity in the upper outer aspect of the breast.  Biopsy took place on 11/07/05, the results of which showed features suspicious for lymphovascular space involvement by tumor.  There are no discrete tumor cells seen, but there were atypical neoplastic epithelial cells see in the lymphovascular space.  Insufficient tissue was available for prognostic panel.  A second biopsy has been done on 11/29/05.  MRI scan of both breasts done on 11/16/05 showed a solitary enhancing mass in the right subareolar area measuring 3.3 x 1.7 x 1.8 cm.  The left breast was negative.  (clinical stage IIA, T2 N0).  Ms. Weber has since undergone fairly extensive staging evaluation with CT/PET, bone scan.  The CT scan and PET scan showed postsurgical changes in the upper chest secondary to previous thyroid surgery.  Finding of subpleural nodules, right lower lobe.   Subcentimeter low-density lesion, anterior aspect of the left hepatic lobe, suggestive of cysts.  The PET scan done at the same time showed potential findings for micrometastases in the lymph nodes of the right axilla.  The tumor at the subareolar location had an SUV of 2.9.  A baseline 2D echo showed ejection fraction of 55% with normal wall motion.The patient had a right breast needle core biopsy on 11/07/2005 which showed features highly suspicious for lymphovascular space involvement by tumor, atypical ductal hyperplasia and hyalinized fibrosis.The patient had a left breast needle core biopsy at the 11-12 o'clock position on 11/29/2005 which showed invasive mammary carcinoma, estrogen receptor 100% positive, progesterone receptor 50% positive, Ki-67 15%, HER-2/neu 2+ equivocal, HER-2/neu by FISH showed no amplification with a ratio of 1.1.  The patient became a participant in a research study NSABP B-40 in 11/2005. The patient had neoadjuvant chemotherapy in accordance with research study protocol NSABP B-40 consisting of Taxotere/Gemzar/Avastin from 12/21/2005 through 03/08/2006 x 4 cycles with Neupogen support.  This was followed by neoadjuvant chemotherapy per B-40 research protocol consisting of Adriamycin/Cytoxan/Avastin from 03/22/2006 through 05/24/2006 x 4 cycles with Neulasta support. 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   The tumor was located immediately subjacent to  the nipple and the nipple/superficial margin was involved. She has positive surgical margin that required reexcision of the nipple-areola complex. Patient then underwent right breast simple mastectomy and  left breast simple mastectomy on 09/06/2006 which showed in the right breast no residual carcinoma identified, margins negative, no evidence of Paget's disease of the nipple, 2 fibroadenomas, and the left breast showed no carcinoma identified, 0.2 cm fibroadenoma, fibrocystic change associated with microcalcifications, including ductal ectasia, alteration without atypia, adenosis, and fibrosis, margins negative.  Reconstruction completed. The patient received genetic counseling and testing and comprehensive BRACAnalysis report dated 08/10/2006 showed no mutation detected in the BRCA1 or BRCA2 breast cancer genes. The patient restarted Avastin therapy in 09/2006 and it was discontinued 10/2006 due to grade 3 myalgias and arthralgias.  Avastin discontinuation ended her participation in NSABP B40 protocol study. The patient started antiestrogen therapy with Arimidex in 11/2006.   Antiestrogen therapy was changed to Femara in 11/2007.  The patient started antiestrogen therapy with Tamoxifen in 07/2010.  Her subsequent history is as detailed below  INTERVAL HISTORY: Lisa Guzman returns today for follow-up of her remote breast cancer. She continues on tamoxifen, chiefly to allow her to use vaginal estrogens safely. That strategy is working well for her or at least "well enough".   Lisa Guzman is doing well today.  She notes bilateral aches and pain at the base of her thumbs.  She says this started around chemo and has progressively worsened.  She is using diclofenac gel for this.  She also notes progressively worsening headaches.  She is taking abortive therapy.  She notes that it goes in phases, and will sometimes need therapy 3 times per week, and can sometimes go as long as a month without needing medication.  These are located in the frontal region of her head and are a constant ache of about 4-5 and then resolve after about a day and a half with Relpax.    REVIEW OF SYSTEMS: Lisa Guzman sees her PCP regularly.  She exercises  regularly.  Former smoker, quit 12 years ago.  Smoked less than 1ppd for 15 years.  She is up to date with colon cancer screening, last skin cancer screening 4 years ago.  She is s/p TAH, she has a cervix, and has not undergone pap smear in some time.    Lisa Guzman denies vision changes, weakness or numbness.  She denies any new pain other than what is noted in interval history.  She has no vaginal issues, continues on linzess for chronic idiopathic constipation, no nausea vomiting, no chest pain, palpitations, or shortness of breath.  She is without any breast changes or concerns.  A detailed ROS was non contributory.     PAST MEDICAL HISTORY: Past Medical History:  Diagnosis Date  . Anxiety   . Breast cancer, right breast ([employer]) 02/16/2011   Invasive ductal carcinoma  . Endometriosis   . Hx of thyroid cancer 02/16/2011  . Hyperlipidemia   . Migraine   . Thyroid disease 1977   Thyroid cancer  History of thyroid cancer in 1977.  She underwent surgery at [facility] in [place], [place].  She apparently had a tumor in her thyroid with 5 involved lymph nodes.  She did have what sounds like external beam radiation therapy.  We do not have any records regarding the thyroid cancer.   PAST SURGICAL HISTORY: Past Surgical History:  Procedure Laterality Date  . ABDOMINAL HYSTERECTOMY     TAH - BSO  . BREAST RECONSTRUCTION WITH  PLACEMENT OF TISSUE EXPANDER AND FLEX HD (ACELLULAR HYDRATED DERMIS)    . BREAST SURGERY     Lumpectomy (06/2006)  Bilateral mastectomy (08/2006)  . ENDOMETRIAL ABLATION      Ablation x 3  . LAMINECTOMY  2007   L4-L5 Laminectomy  . TONSILLECTOMY AND ADENOIDECTOMY     Age 31  . TOTAL THYROIDECTOMY  [place]  In 2001, she underwent hysterectomy with bilateral oophorectomy for what sounds like secondary to endometriosis and complications of bleeding.  She was found also to have fibroid tumor. She has also had laser surgery for endometriosis a  number of times.  In March 2007, she had a microdiskectomy of the lumbar spine.       FAMILY HISTORY Family History  Problem Relation Age of Onset  . Tuberculosis Mother   . Tuberculosis Father   . Hypertension Father   . Cancer Father        Prostate cancer  . Cancer Maternal Grandmother        Breast cancer  . Dementia Maternal Grandmother   . Diabetes Brother   Both parents have had tuberculosis and actually met in a sanitarium.  She has a maternal grandmother who had breast cancer in  her late 86 or early nineties.  She has two brothers and one sister.  One lives in [place] and two live in [place] state. Her parents currently live in [place].   There is no other history of breast or ovarian cancer in the family. BRCA testing negative.  GYNECOLOGIC HISTORY: Gravida 1 para 2 (a set identical twins), menses at age 53, age of parity 24, status post hysterectomy and bilateral salpingo-oophorectomy at age 54, use birth control pills from ages 62 through 41, used estradiol hormone replacement therapy for 6-7 years after hysterectomy.  SOCIAL HISTORY: Mr. and Lisa Guzman has been married since 24.  They have twin 59 year-old boys.  They have one grandchild on the way.  They have lived in [place] since 2006. Her husband is the Psychologist, forensic, Clinical biochemist.  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  She currently volunteers at the surgical waiting area, but has previously been a volunteer at the front desk of the [facility] breast cancer center.  In her spare time she enjoys working out, studying to speaks [place], cooking and reading.   ADVANCED DIRECTIVES: Not on file   HEALTH MAINTENANCE: Social History   Tobacco Use  . Smoking status: Former Research scientist (life sciences)  . Smokeless tobacco: Never Used  . Tobacco comment: Smoked cigaretts from ages [age]  Substance Use Topics  . Alcohol use: Yes    Alcohol/week: 7.0 standard drinks     Types: 7 Glasses of wine per week  . Drug use: No    Colonoscopy: Dr Collene Mares PAP:  Bone density: The patient's last bone density scan on 12/28/2010 showed a T score of -0.4 (normal). Lipid panel: Not on file  No Known Allergies  Current Outpatient Medications  Medication Sig Dispense Refill  . cholecalciferol (VITAMIN D) 1000 UNITS tablet Take 1,000 Units by mouth daily.    . diclofenac sodium (VOLTAREN) 1 % GEL APPLY GEL TO AFFECTED AREA UP TO 4 TIMES DAILY  1  . ibuprofen (ADVIL,MOTRIN) 200 MG tablet Take 400 mg by mouth every 6 (six) hours as needed for moderate pain.    Marland Kitchen levothyroxine (SYNTHROID, LEVOTHROID) 175 MCG tablet Take 150 mcg by mouth every morning.    Marland Kitchen  linaclotide (LINZESS) 145 MCG CAPS capsule Take 145 mcg by mouth daily before breakfast.    . Melatonin ER 5 MG TBCR Take 5 mg by mouth at bedtime as needed (sleep).     . RELPAX 40 MG tablet Take 40 mg by mouth every 2 (two) hours as needed for migraine.     . rosuvastatin (CRESTOR) 10 MG tablet Take 10 mg by mouth daily.    . tamoxifen (NOLVADEX) 20 MG tablet TAKE 1 TABLET DAILY 90 tablet 3  . YUVAFEM 10 MCG TABS vaginal tablet INSERT 1 TABLET VAGINALLY 3 TIMES A WEEK 13 tablet 10  . zolpidem (AMBIEN) 10 MG tablet Take 10 mg by mouth.     No current facility-administered medications for this visit.     OBJECTIVE:  Vitals:   10/13/17 0837  BP: 122/75  Pulse: 66  Resp: 19  Temp: 98.4 F (36.9 C)  SpO2: 100%     Body mass index is 22.06 kg/m.      ECOG FS: 1 GENERAL: Patient is a well appearing female in no acute distress HEENT:  Sclerae anicteric.  Oropharynx clear and moist. No ulcerations or evidence of oropharyngeal candidiasis. Neck is supple.  NODES:  No cervical, supraclavicular, or axillary lymphadenopathy palpated.  BREAST EXAM:  S/p bilateral mastectomies and implant placement, no sign of recurrence. LUNGS:  Clear to auscultation bilaterally.  No wheezes or rhonchi. HEART:  Regular rate and  rhythm. No murmur appreciated. ABDOMEN:  Soft, nontender.  Positive, normoactive bowel sounds. No organomegaly palpated. MSK:  No focal spinal tenderness to palpation. Full range of motion bilaterally in the upper extremities. EXTREMITIES:  No peripheral edema.   SKIN:  Clear with no obvious rashes or skin changes. No nail dyscrasia. NEURO:  Nonfocal. Well oriented.  Appropriate affect.      LAB RESULTS: CBC    Component Value Date/Time   WBC 6.5 10/13/2017 0827   RBC 4.32 10/13/2017 0827   HGB 12.9 10/13/2017 0827   HGB 12.2 09/09/2015 1230   HCT 39.2 10/13/2017 0827   HCT 35.5 09/09/2015 1230   PLT 244 10/13/2017 0827   PLT 244 09/09/2015 1230   MCV 90.7 10/13/2017 0827   MCV 87.4 09/09/2015 1230   MCH 29.9 10/13/2017 0827   MCHC 32.9 10/13/2017 0827   RDW 12.9 10/13/2017 0827   RDW 12.9 09/09/2015 1230   LYMPHSABS 2.8 10/13/2017 0827   LYMPHSABS 2.4 09/09/2015 1230   MONOABS 0.7 10/13/2017 0827   MONOABS 0.5 09/09/2015 1230   EOSABS 0.3 10/13/2017 0827   EOSABS 0.1 09/09/2015 1230   BASOSABS 0.1 10/13/2017 0827   BASOSABS 0.0 09/09/2015 1230    CMP Latest Ref Rng & Units 09/26/2016 09/09/2015 09/11/2014  Glucose 70 - 140 mg/dl 89 77 91  BUN 7.0 - 26.0 mg/dL 13.7 18.6 13.8  Creatinine 0.6 - 1.1 mg/dL 0.9 0.9 0.9  Sodium 136 - 145 mEq/L 137 138 139  Potassium 3.5 - 5.1 mEq/L 4.2 3.8 4.4  Chloride 98 - 107 mEq/L - - -  CO2 22 - 29 mEq/L 23 24 23   Calcium 8.4 - 10.4 mg/dL 9.3 9.2 8.9  Total Protein 6.4 - 8.3 g/dL 7.3 7.0 7.0  Total Bilirubin 0.20 - 1.20 mg/dL 0.40 0.57 0.44  Alkaline Phos 40 - 150 U/L 70 58 60  AST 5 - 34 U/L 24 26 27   ALT 0 - 55 U/L 19 17 23     STUDIES: No results found.   ASSESSMENT: 57 y.o. woman:  1.  The patient has a history of thyroid cancer and underwent thyroidectomy surgery in 1977 at [facility] in [place], [place].  She had a tumor in her thyroid with 5 involved lymph nodes.  She completed external beam radiation  therapy/radioactive iodine ablation.    2.  The patient had a right breast needle core biopsy on 11/07/2005 which showed features highly suspicious for lymphovascular space involvement by tumor, atypical ductal hyperplasia and hyalinized fibrosis.  3.  The patient had a bilateral breast MRI on 11/16/2005 which showed a dense enhancing pattern, bilaterally.  In the right subareolar region, there was a solitary obscured enhancing mass. The dimensions are difficult to give secondary to the patient's bilateral dense enhancing pattern.  It measured approximately 3.3 x 1.7 x 1.8 cm. in transverse, craniocaudal and anterior posterior dimensions. There were no abnormal enhancements in the left breast. No enlarged axillary adenopathy is detected (clinical stage IIA, T2 N0).    4.  The patient had a left breast needle core biopsy at the 11-12 o'clock position on 11/29/2005 which showed invasive mammary carcinoma, estrogen receptor 100% positive, progesterone receptor 50% positive, Ki-67 15%, HER-2/neu 2+ equivocal, HER-2/neu by FISH showed no amplification with a ratio of 1.1.  5.  The patient became a participant in a research study NSABP B-40 in 11/2005.  6.  The patient had neoadjuvant chemotherapy in accordance with research study protocol NSABP B-40 consisting of Taxotere/Gemzar/Avastin from 12/21/2005 through 03/08/2006 x 4 cycles with Neupogen support.  This was followed by neoadjuvant chemotherapy per B-40 research protocol consisting of Adriamycin/Cytoxan/Avastin from 03/22/2006 through 05/24/2006 x 4 cycles with Neulasta support.  7.  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   The tumor was  located immediately subjacent to the nipple and the nipple/superficial margin was involved. She has positive surgical margin that required reexcision of the nipple-areola complex.  8.  Status post right breast simple mastectomy and left breast simple mastectomy on 09/06/2006 which showed in the right breast no residual carcinoma identified, margins negative, no evidence of Paget's disease of the nipple, 2 fibroadenomas, and the left breast showed no carcinoma identified, 0.2 cm fibroadenoma, fibrocystic change associated with microcalcifications, including ductal ectasia, alteration without atypia, adenosis, and fibrosis, margins negative.  Reconstruction completed.  9.  The patient received genetic counseling and testing and comprehensive BRACAnalysis report dated 08/10/2006 showed no mutation detected in the BRCA1 or BRCA2 breast cancer genes.  10.  The patient restarted Avastin therapy in 09/2006 and it was discontinued 10/2006 due to grade 3 myalgias and arthralgias.  Avastin discontinuation ended her participation in NSABP B40 protocol study.  11.  The patient started antiestrogen therapy with Arimidex in 11/2006.   Antiestrogen therapy was changed to Femara in 11/2007.  The patient started antiestrogen therapy with Tamoxifen in 07/2010.  12.  Vaginal dryness/dyspareunia and she uses Vagifem for that.  13. History of colon polyps: She will need another colonoscopy to be set up with Dr Collene Mares. Last one revealed 2 benign polyps and familial history of polyposis as well.   PLAN:  Kilea is doing moderately well today.  She has no sign of recurrence today.  She will continue Tamoxifen for now.  We reviewed the issue  of her aching at the base of her thumbs.  This may potentially be worse due to tamoxifen.  We reviewed the option for her to stop the vaginal estrogens and tamoxifen to see if it helps. She will consider this.    Ane and I also reviewed her health maintenance.  We recommend she stay up  to date with colon cancer screening, skin cancer screening, and cervical cancer screening (since according to her she still has a cervix).  Healthy diet and exercise were also recommended.    [place] and I discussed her headaches in length.  I recommended that she continue to work with her PCP about these, but if they continue to worsen, would recommend brain MRI and possibly neurology referral.  She is in agreement and knows that if she needs me to order the MRI I am happy to.  She wants to hold off on this for now.    Lakechia will return in one year for labs and f/u with Dr. Jana Hakim.  She knows to call for any problems that may develop before her next visit here.  A total of (30) minutes of face-to-face time was spent with this patient with greater than 50% of that time in counseling and care-coordination.   Lisa Bihari, NP  10/13/17 9:33 AM Medical Oncology and Hematology [facility] [address] [place], [place] [address] Tel. [phone]    Fax. [phone]

## 2017-10-13 NOTE — Telephone Encounter (Signed)
Gave pt avs and calendar  °

## 2018-10-13 ENCOUNTER — Other Ambulatory Visit: Payer: Self-pay | Admitting: Oncology

## 2018-10-14 NOTE — Progress Notes (Signed)
[place]  [phone] [phone]  OFFICE PROGRESS NOTE   ID: [name]   DOB: 08-28-1960  MR#: [id number]  FYB#:[id number]   PCP: Aletha Halim., PA-C SU: Neldon Mc, M.D.[ SU: Erline Hau, M.D. OTHER: Dian Queen MD  Chief complaint: Estrogen receptor positive breast cancer; s/p bilateral mastectomies  Current therapy: Tamoxifen   BREAST CANCER HISTORY: From the earlier summary note:  This is a 58 years old patient from [place] who was last seen here on 09/05/2012 by Ailene Ards NP and comes for a follow up visit. She also have a known history of thyroid cancer when she was young treated with surgery and RAI. She has undergone routine mammography on an annual basis.  Her last mammogram was in March 2007.  She typically has fairly dense breasts and noted a mass in her right breast.  She was in turn sent for a mammogram that was done at [facility].  A subsequent ultrasound done at [facility] on 11/07/05 showed an irregular mass-like hypoechoic area measuring 2.7 x 2 x 1.5 cm.  The physical exam at that time showed an area of papular nodularity in the upper outer aspect of the breast.  Biopsy took place on 11/07/05, the results of which showed features suspicious for lymphovascular space involvement by tumor.  There are no discrete tumor cells seen, but there were atypical neoplastic epithelial cells see in the lymphovascular space.  Insufficient tissue was available for prognostic panel.  A second biopsy has been done on 11/29/05.  MRI scan of both breasts done on 11/16/05 showed a solitary enhancing mass in the right subareolar area measuring 3.3 x 1.7 x 1.8 cm.  The left breast was negative.  (clinical stage IIA, T2 N0).  Ms. Lamarche has since undergone fairly extensive staging evaluation with CT/PET, bone scan.  The CT scan and PET scan showed postsurgical changes in the upper chest secondary to previous  thyroid surgery.  Finding of subpleural nodules, right lower lobe.  Subcentimeter low-density lesion, anterior aspect of the left hepatic lobe, suggestive of cysts.  The PET scan done at the same time showed potential findings for micrometastases in the lymph nodes of the right axilla.  The tumor at the subareolar location had an SUV of 2.9.  A baseline 2D echo showed ejection fraction of 55% with normal wall motion.The patient had a right breast needle core biopsy on 11/07/2005 which showed features highly suspicious for lymphovascular space involvement by tumor, atypical ductal hyperplasia and hyalinized fibrosis.The patient had a left breast needle core biopsy at the 11-12 o'clock position on 11/29/2005 which showed invasive mammary carcinoma, estrogen receptor 100% positive, progesterone receptor 50% positive, Ki-67 15%, HER-2/neu 2+ equivocal, HER-2/neu by FISH showed no amplification with a ratio of 1.1.  The patient became a participant in a research study NSABP B-40 in 11/2005. The patient had neoadjuvant chemotherapy in accordance with research study protocol NSABP B-40 consisting of Taxotere/Gemzar/Avastin from 12/21/2005 through 03/08/2006 x 4 cycles with Neupogen support.  This was followed by neoadjuvant chemotherapy per B-40 research protocol consisting of Adriamycin/Cytoxan/Avastin from 03/22/2006 through 05/24/2006 x 4 cycles with Neulasta support. 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   The  tumor was located immediately subjacent to the nipple and the nipple/superficial margin was involved. She has positive surgical margin that required reexcision of the nipple-areola  complex. Patient then underwent right breast simple mastectomy and left breast simple mastectomy on 09/06/2006 which showed in the right breast no residual carcinoma identified, margins negative, no evidence of Paget's disease of the nipple, 2 fibroadenomas, and the left breast showed no carcinoma identified, 0.2 cm fibroadenoma, fibrocystic change associated with microcalcifications, including ductal ectasia, alteration without atypia, adenosis, and fibrosis, margins negative.  Reconstruction completed. The patient received genetic counseling and testing and comprehensive BRACAnalysis report dated 08/10/2006 showed no mutation detected in the BRCA1 or BRCA2 breast cancer genes. The patient restarted Avastin therapy in 09/2006 and it was discontinued 10/2006 due to grade 3 myalgias and arthralgias.  Avastin discontinuation ended her participation in NSABP B40 protocol study. The patient started antiestrogen therapy with Arimidex in 11/2006.   Antiestrogen therapy was changed to Femara in 11/2007.  The patient started antiestrogen therapy with Tamoxifen in 07/2010.  Her subsequent history is as detailed below   INTERVAL HISTORY: Lisa Guzman returns today for follow-up and treatment of her remote breast cancer. She was last seen here on 10/13/2017.   She continues on tamoxifen, which she takes chiefly for safety while using vaginal estrogens.  She has no side effects from either medication.  She does however still have dyspareunia  Since her last visit here, she  She is status post bilateral mastectomy and does not require annual mammography.   REVIEW OF SYSTEMS: Kendelle is currently of course not volunteering at the hospital because of the pandemic.  She is taking long walks with her dog in the Trail system around [place].  She is expecting a grandchild in the next few months.  A detailed review of systems today was otherwise stable   PAST MEDICAL HISTORY: Past Medical History:  Diagnosis Date  .  Anxiety   . Breast cancer, right breast ([place]) 02/16/2011   Invasive ductal carcinoma  . Endometriosis   . Hx of thyroid cancer 02/16/2011  . Hyperlipidemia   . Migraine   . Thyroid disease 1977   Thyroid cancer  History of thyroid cancer in 1977.  She underwent surgery at [facility] in [place], [place].  She apparently had a tumor in her thyroid with 5 involved lymph nodes.  She did have what sounds like external beam radiation therapy.  We do not have any records regarding the thyroid cancer.   PAST SURGICAL HISTORY: Past Surgical History:  Procedure Laterality Date  . ABDOMINAL HYSTERECTOMY     TAH - BSO  . BREAST RECONSTRUCTION WITH PLACEMENT OF TISSUE EXPANDER AND FLEX HD (ACELLULAR HYDRATED DERMIS)    . BREAST SURGERY     Lumpectomy (06/2006)  Bilateral mastectomy (08/2006)  . ENDOMETRIAL ABLATION      Ablation x 3  . LAMINECTOMY  2007   L4-L5 Laminectomy  . TONSILLECTOMY AND ADENOIDECTOMY     Age 21  . TOTAL THYROIDECTOMY  [place]  In 2001, she underwent hysterectomy with bilateral oophorectomy for what sounds like secondary to endometriosis and complications of bleeding.  She was found also to have fibroid tumor. She has also had laser surgery for endometriosis a number of times.  In March 2007, she had a microdiskectomy of the lumbar spine.       FAMILY HISTORY Family History  Problem Relation Age of Onset  . Tuberculosis Mother   .  Tuberculosis Father   . Hypertension Father   . Cancer Father        Prostate cancer  . Cancer Maternal Grandmother        Breast cancer  . Dementia Maternal Grandmother   . Diabetes Brother   Both parents have had tuberculosis and actually met in a sanitarium.  She has a maternal grandmother who had breast cancer in  her late 40 or early nineties.  She has two brothers and one sister.  One lives in [place] and two live in [place] state. Her parents currently live in [place].    There is no other history of breast or ovarian cancer in the family. BRCA testing negative.  GYNECOLOGIC HISTORY: Gravida 1 para 2 (a set identical twins), menses at age 23, age of parity 38, status post hysterectomy and bilateral salpingo-oophorectomy at age 21, use birth control pills from ages 35 through 11, used estradiol hormone replacement therapy for 6-7 years after hysterectomy.  SOCIAL HISTORY: (Updated September 2020 Mr. and Mrs. Thissen has been married since 69.  They have twin 40 year-old sons.  They have one grandchild on the way.  They have lived in [place] since 2006. Her husband is the Psychologist, forensic, Clinical biochemist.  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  She currently volunteers at the surgical waiting area, but has previously been a volunteer at the front desk of the [facility] breast cancer center.  In her spare time she enjoys working out, studying to speaks [place], cooking and reading.   ADVANCED DIRECTIVES: Not on file   HEALTH MAINTENANCE: Social History   Tobacco Use  . Smoking status: Former Research scientist (life sciences)  . Smokeless tobacco: Never Used  . Tobacco comment: Smoked cigaretts from ages [age]  Substance Use Topics  . Alcohol use: Yes    Alcohol/week: 7.0 standard drinks    Types: 7 Glasses of wine per week  . Drug use: No    Colonoscopy: Dr Collene Mares PAP:  Bone density: The patient's last bone density scan on 12/28/2010 showed a T score of -0.4 (normal). Lipid panel: Not on file  No Known Allergies  Current Outpatient Medications  Medication Sig Dispense Refill  . cholecalciferol (VITAMIN D) 1000 UNITS tablet Take 1,000 Units by mouth daily.    . diclofenac sodium (VOLTAREN) 1 % GEL APPLY GEL TO AFFECTED AREA UP TO 4 TIMES DAILY  1  . ibuprofen (ADVIL,MOTRIN) 200 MG tablet Take 400 mg by mouth every 6 (six) hours as needed for moderate pain.    Marland Kitchen levothyroxine (SYNTHROID, LEVOTHROID) 175 MCG tablet Take 150 mcg by  mouth every morning.    . linaclotide (LINZESS) 145 MCG CAPS capsule Take 145 mcg by mouth daily before breakfast.    . Melatonin ER 5 MG TBCR Take 5 mg by mouth at bedtime as needed (sleep).     . RELPAX 40 MG tablet Take 40 mg by mouth every 2 (two) hours as needed for migraine.     . rosuvastatin (CRESTOR) 10 MG tablet Take 10 mg by mouth daily.    . tamoxifen (NOLVADEX) 20 MG tablet TAKE 1 TABLET DAILY 90 tablet 3  . YUVAFEM 10 MCG TABS vaginal tablet INSERT 1 TABLET VAGINALLY 3 TIMES A WEEK 13 tablet 10  . zolpidem (AMBIEN) 10 MG tablet Take 10 mg by mouth.     No current facility-administered medications for this visit.     OBJECTIVE: Middle-aged white woman who  appears younger than stated age  50:   10/15/18 0841  BP: 113/84  Pulse: (!) 59  Resp: 17  Temp: 98.2 F (36.8 C)  SpO2: 99%   Wt Readings from Last 3 Encounters:  10/15/18 135 lb 14.4 oz (61.6 kg)  10/13/17 145 lb 1.6 oz (65.8 kg)  10/03/16 146 lb 4.8 oz (66.4 kg)   Body mass index is 20.66 kg/m.    ECOG FS:0 - Asymptomatic  Ocular: Sclerae unicteric, pupils round and equal Ear-nose-throat: Wearing a mask Lymphatic: No cervical or supraclavicular adenopathy Lungs no rales or rhonchi Heart regular rate and rhythm Abd soft, nontender, positive bowel sounds MSK no focal spinal tenderness, no joint edema Neuro: non-focal, well-oriented, appropriate affect Breasts: Status post bilateral mastectomies with bilateral silicone implant reconstruction.  No evidence of local recurrence.  Both axillae are benign.   LAB RESULTS: CBC    Component Value Date/Time   WBC 7.7 10/15/2018 0829   RBC 4.25 10/15/2018 0829   HGB 13.2 10/15/2018 0829   HGB 12.2 09/09/2015 1230   HCT 40.0 10/15/2018 0829   HCT 35.5 09/09/2015 1230   PLT 285 10/15/2018 0829   PLT 244 09/09/2015 1230   MCV 94.1 10/15/2018 0829   MCV 87.4 09/09/2015 1230   MCH 31.1 10/15/2018 0829   MCHC 33.0 10/15/2018 0829   RDW 12.4 10/15/2018  0829   RDW 12.9 09/09/2015 1230   LYMPHSABS 3.1 10/15/2018 0829   LYMPHSABS 2.4 09/09/2015 1230   MONOABS 0.6 10/15/2018 0829   MONOABS 0.5 09/09/2015 1230   EOSABS 0.2 10/15/2018 0829   EOSABS 0.1 09/09/2015 1230   BASOSABS 0.1 10/15/2018 0829   BASOSABS 0.0 09/09/2015 1230    CMP Latest Ref Rng & Units 10/13/2017 09/26/2016 09/09/2015  Glucose 70 - 99 mg/dL 134(H) 89 77  BUN 6 - 20 mg/dL 13 13.7 18.6  Creatinine 0.44 - 1.00 mg/dL 0.87 0.9 0.9  Sodium 135 - 145 mmol/L 140 137 138  Potassium 3.5 - 5.1 mmol/L 4.3 4.2 3.8  Chloride 98 - 111 mmol/L 107 - -  CO2 22 - 32 mmol/L 25 23 24   Calcium 8.9 - 10.3 mg/dL 8.9 9.3 9.2  Total Protein 6.5 - 8.1 g/dL 6.7 7.3 7.0  Total Bilirubin 0.3 - 1.2 mg/dL 0.4 0.40 0.57  Alkaline Phos 38 - 126 U/L 71 70 58  AST 15 - 41 U/L 20 24 26   ALT 0 - 44 U/L 14 19 17     STUDIES: No results found.   ASSESSMENT: 58 y.o. woman:  1.  The patient has a history of thyroid cancer and underwent thyroidectomy surgery in 1977 at [facility] in [place], [place].  She had a tumor in her thyroid with 5 involved lymph nodes.  She completed external beam radiation therapy/radioactive iodine ablation.    2.  The patient had a right breast needle core biopsy on 11/07/2005 which showed features highly suspicious for lymphovascular space involvement by tumor, atypical ductal hyperplasia and hyalinized fibrosis.  3.  The patient had a bilateral breast MRI on 11/16/2005 which showed a dense enhancing pattern, bilaterally.  In the right subareolar region, there was a solitary obscured enhancing mass. The dimensions are difficult to give secondary to the patient's bilateral dense enhancing pattern.  It measured approximately 3.3 x 1.7 x 1.8 cm. in transverse, craniocaudal and anterior posterior dimensions. There were no abnormal enhancements in the left breast. No enlarged axillary adenopathy is detected (clinical stage IIA, T2 N0).  4.  The patient had a  left breast needle core biopsy at the 11-12 o'clock position on 11/29/2005 which showed invasive mammary carcinoma, estrogen receptor 100% positive, progesterone receptor 50% positive, Ki-67 15%, HER-2/neu 2+ equivocal, HER-2/neu by FISH showed no amplification with a ratio of 1.1.  5.  The patient became a participant in a research study NSABP B-40 in 11/2005.  6.  The patient had neoadjuvant chemotherapy in accordance with research study protocol NSABP B-40 consisting of Taxotere/Gemzar/Avastin from 12/21/2005 through 03/08/2006 x 4 cycles with Neupogen support.  This was followed by neoadjuvant chemotherapy per B-40 research protocol consisting of Adriamycin/Cytoxan/Avastin from 03/22/2006 through 05/24/2006 x 4 cycles with Neulasta support.  7.  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   The tumor was located immediately subjacent to the nipple and the nipple/superficial margin was involved. She has positive surgical margin that required reexcision of the nipple-areola complex.  8.  Status post right breast simple mastectomy and left breast simple mastectomy on 09/06/2006 which showed in the right breast no residual carcinoma identified, margins negative, no evidence of Paget's disease of the nipple, 2 fibroadenomas, and the left breast showed no carcinoma identified, 0.2 cm fibroadenoma, fibrocystic change associated with microcalcifications, including ductal ectasia, alteration without atypia, adenosis, and fibrosis, margins negative.  Reconstruction completed.  9.  The patient received genetic counseling and testing and comprehensive BRACAnalysis report dated 08/10/2006 showed no  mutation detected in the BRCA1 or BRCA2 breast cancer genes.  10.  The patient restarted Avastin therapy in 09/2006 and it was discontinued 10/2006 due to grade 3 myalgias and arthralgias.  Avastin discontinuation ended her participation in NSABP B40 protocol study.  11.  The patient started antiestrogen therapy with Arimidex in 11/2006.   Antiestrogen therapy was changed to Femara in 11/2007.  The patient started antiestrogen therapy with Tamoxifen in 07/2010.  12.  Vaginal dryness/dyspareunia and she uses Vagifem for that.  13. History of colon polyps: She will need another colonoscopy to be set up with Dr Collene Mares. Last one revealed 2 benign polyps and familial history of polyposis as well.   PLAN:  Carlo is now 12 years out from definitive surgery for her breast cancer with no evidence of disease recurrence.  This is very favorable.  The only reason she continues on tamoxifen is so that she can use vaginal estrogen safely.  This may be overkill.  In any case she is still having dyspareunia.  I am referring her to help our pelvic rehab program.  I suggested she go off both tamoxifen and vaginal estrogens for 3 months and reassess in January.  If it really made no difference and she can be off both medications and in that case she really can discontinue follow-up here.  Otherwise she will go back on both and I will see her again in a year.  Incidentally we talked about the fact that I do not know whether her silicone implants are ridged or smooth.  She will contact Dr. Bernette Redbird office regarding that.  She is aware of the concern of the rare lymphomas with the rigid implants  She knows to call for any other issue that may develop before the next visit.    Lewis Keats, Virgie Dad,  MD  10/15/18 8:42 AM Medical Oncology and Hematology [facility] [place], [place] [address] Tel. [phone]    Fax. [phone]  I, Jacqualyn Posey am acting as a Education administrator for Chauncey Cruel, MD.   I, Lurline Del MD, have reviewed the above documentation for accuracy and completeness, and I agree with the above.

## 2018-10-15 ENCOUNTER — Other Ambulatory Visit: Payer: Self-pay

## 2018-10-15 ENCOUNTER — Inpatient Hospital Stay (HOSPITAL_BASED_OUTPATIENT_CLINIC_OR_DEPARTMENT_OTHER): Payer: BC Managed Care – PPO | Admitting: Oncology

## 2018-10-15 ENCOUNTER — Inpatient Hospital Stay: Payer: BC Managed Care – PPO | Attending: Oncology

## 2018-10-15 DIAGNOSIS — Z87891 Personal history of nicotine dependence: Secondary | ICD-10-CM | POA: Diagnosis not present

## 2018-10-15 DIAGNOSIS — C50811 Malignant neoplasm of overlapping sites of right female breast: Secondary | ICD-10-CM

## 2018-10-15 DIAGNOSIS — Z90722 Acquired absence of ovaries, bilateral: Secondary | ICD-10-CM | POA: Diagnosis not present

## 2018-10-15 DIAGNOSIS — Z923 Personal history of irradiation: Secondary | ICD-10-CM | POA: Insufficient documentation

## 2018-10-15 DIAGNOSIS — Z8719 Personal history of other diseases of the digestive system: Secondary | ICD-10-CM | POA: Diagnosis not present

## 2018-10-15 DIAGNOSIS — C50111 Malignant neoplasm of central portion of right female breast: Secondary | ICD-10-CM | POA: Insufficient documentation

## 2018-10-15 DIAGNOSIS — Z79899 Other long term (current) drug therapy: Secondary | ICD-10-CM | POA: Diagnosis not present

## 2018-10-15 DIAGNOSIS — Z85850 Personal history of malignant neoplasm of thyroid: Secondary | ICD-10-CM | POA: Diagnosis not present

## 2018-10-15 DIAGNOSIS — C773 Secondary and unspecified malignant neoplasm of axilla and upper limb lymph nodes: Secondary | ICD-10-CM | POA: Diagnosis not present

## 2018-10-15 DIAGNOSIS — E785 Hyperlipidemia, unspecified: Secondary | ICD-10-CM | POA: Insufficient documentation

## 2018-10-15 DIAGNOSIS — Z803 Family history of malignant neoplasm of breast: Secondary | ICD-10-CM | POA: Diagnosis not present

## 2018-10-15 DIAGNOSIS — Z9013 Acquired absence of bilateral breasts and nipples: Secondary | ICD-10-CM | POA: Insufficient documentation

## 2018-10-15 DIAGNOSIS — Z170 Estrogen receptor positive status [ER+]: Secondary | ICD-10-CM | POA: Diagnosis not present

## 2018-10-15 DIAGNOSIS — C50911 Malignant neoplasm of unspecified site of right female breast: Secondary | ICD-10-CM

## 2018-10-15 DIAGNOSIS — Z90710 Acquired absence of both cervix and uterus: Secondary | ICD-10-CM | POA: Insufficient documentation

## 2018-10-15 DIAGNOSIS — F419 Anxiety disorder, unspecified: Secondary | ICD-10-CM | POA: Diagnosis not present

## 2018-10-15 DIAGNOSIS — Z79810 Long term (current) use of selective estrogen receptor modulators (SERMs): Secondary | ICD-10-CM | POA: Diagnosis not present

## 2018-10-15 DIAGNOSIS — R918 Other nonspecific abnormal finding of lung field: Secondary | ICD-10-CM | POA: Insufficient documentation

## 2018-10-15 LAB — CBC WITH DIFFERENTIAL/PLATELET
Abs Immature Granulocytes: 0.04 10*3/uL (ref 0.00–0.07)
Basophils Absolute: 0.1 10*3/uL (ref 0.0–0.1)
Basophils Relative: 1 %
Eosinophils Absolute: 0.2 10*3/uL (ref 0.0–0.5)
Eosinophils Relative: 2 %
HCT: 40 % (ref 36.0–46.0)
Hemoglobin: 13.2 g/dL (ref 12.0–15.0)
Immature Granulocytes: 1 %
Lymphocytes Relative: 40 %
Lymphs Abs: 3.1 10*3/uL (ref 0.7–4.0)
MCH: 31.1 pg (ref 26.0–34.0)
MCHC: 33 g/dL (ref 30.0–36.0)
MCV: 94.1 fL (ref 80.0–100.0)
Monocytes Absolute: 0.6 10*3/uL (ref 0.1–1.0)
Monocytes Relative: 8 %
Neutro Abs: 3.8 10*3/uL (ref 1.7–7.7)
Neutrophils Relative %: 48 %
Platelets: 285 10*3/uL (ref 150–400)
RBC: 4.25 MIL/uL (ref 3.87–5.11)
RDW: 12.4 % (ref 11.5–15.5)
WBC: 7.7 10*3/uL (ref 4.0–10.5)
nRBC: 0 % (ref 0.0–0.2)

## 2018-10-15 LAB — COMPREHENSIVE METABOLIC PANEL
ALT: 16 U/L (ref 0–44)
AST: 23 U/L (ref 15–41)
Albumin: 4 g/dL (ref 3.5–5.0)
Alkaline Phosphatase: 68 U/L (ref 38–126)
Anion gap: 6 (ref 5–15)
BUN: 16 mg/dL (ref 6–20)
CO2: 26 mmol/L (ref 22–32)
Calcium: 8.9 mg/dL (ref 8.9–10.3)
Chloride: 106 mmol/L (ref 98–111)
Creatinine, Ser: 0.84 mg/dL (ref 0.44–1.00)
GFR calc Af Amer: >60 mL/min (ref >60)
GFR calc non Af Amer: >60 mL/min (ref >60)
Glucose, Bld: 106 mg/dL — ABNORMAL HIGH (ref 70–99)
Potassium: 5.3 mmol/L — ABNORMAL HIGH (ref 3.5–5.1)
Sodium: 138 mmol/L (ref 135–145)
Total Bilirubin: 0.4 mg/dL (ref 0.3–1.2)
Total Protein: 6.7 g/dL (ref 6.5–8.1)

## 2018-10-15 MED ORDER — TAMOXIFEN CITRATE 20 MG PO TABS: 20.0000 mg | ORAL_TABLET | Freq: Every day | ORAL | 3 refills | Status: DC

## 2018-10-17 ENCOUNTER — Telehealth: Payer: Self-pay | Admitting: Oncology

## 2018-10-17 NOTE — Telephone Encounter (Signed)
I will mail schedule

## 2018-10-29 ENCOUNTER — Encounter: Payer: Self-pay | Admitting: Physical Therapy

## 2018-10-29 ENCOUNTER — Other Ambulatory Visit: Payer: Self-pay

## 2018-10-29 ENCOUNTER — Ambulatory Visit: Payer: BC Managed Care – PPO | Attending: Oncology | Admitting: Physical Therapy

## 2018-10-29 DIAGNOSIS — R252 Cramp and spasm: Secondary | ICD-10-CM

## 2018-10-29 DIAGNOSIS — M6281 Muscle weakness (generalized): Secondary | ICD-10-CM | POA: Insufficient documentation

## 2018-10-29 NOTE — Patient Instructions (Addendum)
Moisturizers . They are used in the vagina to hydrate the mucous membrane that make up the vaginal canal. . Designed to keep a more normal acid balance (ph) . Once placed in the vagina, it will last between two to three days.  . Use 2-3 times per week at bedtime  . Ingredients to avoid is glycerin and fragrance, can increase chance of infection . Should not be used just before sex due to causing irritation . Most are gels administered either in a tampon-shaped applicator or as a vaginal suppository. They are non-hormonal.   Types of Moisturizers  . Vitamin E vaginal suppositories- Whole foods, Amazon . Moist Again . Coconut oil- can break down condoms . Julva- (Do no use if on Tamoxifen) amazon . Yes moisturizer- amazon . NeuEve Silk , NeuEve Silver for menopausal or over 65 (if have severe vaginal atrophy or cancer treatments use NeuEve Silk for  1 month than move to The [employer])- [employer], MapleFlower.dk . Olive and Bee intimate cream- www.oliveandbee.com.au . Mae vaginal [place] . Aloe   Creams to use externally on the Vulva area  Albertson's (good for for cancer patients that had radiation to the area)- [place] or [employer].FlyingBasics.com.br  V-magic cream - amazon  Julva-amazon  Vital "V [place] Yam salve ( help moisturize and help with thinning vulvar area, does have [place] by Irwin Brakeman labial moisturizer ([place],   Coconut or olive oil  aloe   Things to avoid in the vaginal area . Do not use things to irritate the vulvar area . No lotions just specialized creams for the vulva area- Neogyn, V-magic, No soaps; can use Aveeno or Calendula cleanser if needed. Must be gentle . No deodorants . No douches . Good to sleep without underwear to let the vaginal area to air out . No scrubbing: spread the lips to let warm water rinse over labias and pat dry  Relaxation Exercises with  the Urge to Void   When you experience an urge to void:  FIRST  Stop and stand very still    Sit down if you can    Don't move    You need to stay very still to maintain control  SECOND Squeeze your pelvic floor muscles 5 times, like a quick flick, to keep from leaking  THIRD Relax  Take a deep breath and then let it out  Try to make the urge go away by using relaxation and visualization techniques  FINALLY When you feel the urge go away somewhat, walk normally to the bathroom.   If the urge gets suddenly stronger on the way, you may stop again and relax to regain control.  [facility] [address], [place], [place] [address] Phone # [phone] Fax [phone]

## 2018-10-29 NOTE — Therapy (Signed)
[facility] [address], [place], [place], [address] Phone: [phone]   Fax:  [phone]  Physical Therapy Evaluation  Patient Details  Name: Lisa Guzman MRN: [id number] Date of Birth: Aug 12, 1960 Referring Provider (PT): Magrinat, Virgie Dad, MD   Encounter Date: 10/29/2018  PT End of Session - 10/29/18 1141    Visit Number  1    Date for PT Re-Evaluation  12/24/18    PT Start Time  [id number]    PT Stop Time  1225    PT Time Calculation (min)  40 min    Activity Tolerance  Patient tolerated treatment well    Behavior During Therapy  [facility] for tasks assessed/performed       Past Medical History:  Diagnosis Date  . Anxiety   . 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 Endometriosis   . Hx of thyroid cancer 02/16/2011  . Hyperlipidemia   . Migraine   . Thyroid disease 1977   Thyroid cancer    Past Surgical History:  Procedure Laterality Date  . ABDOMINAL HYSTERECTOMY     TAH - BSO  . BREAST RECONSTRUCTION WITH PLACEMENT OF TISSUE EXPANDER AND FLEX HD (ACELLULAR HYDRATED DERMIS)    . BREAST SURGERY     Lumpectomy (06/2006)  Bilateral mastectomy (08/2006)  . ENDOMETRIAL ABLATION      Ablation x 3  . LAMINECTOMY  2007   L4-L5 Laminectomy  . TONSILLECTOMY AND ADENOIDECTOMY     Age 48  . [place]    There were no vitals filed for this visit.   Subjective Assessment - 10/29/18 1146    Subjective  Pt is having pain with intercourse.  She is on tamoxofin currently only because she is using some estrogen cream vaginally. She occasionally has some Lt lower quadrant pain sometimes.    Patient Stated Goals  Be able to have intercourse and frequent UTIs         [facility] PT Assessment - 10/29/18 0001      Assessment   Medical Diagnosis  C50.811,Z17.0 (ICD-10-CM) - Malignant neoplasm of overlapping sites of right breast in female,  estrogen receptor positive [facility]    Referring Provider (PT)  Magrinat, Virgie Dad, MD    Prior Therapy  No      Precautions   Precautions  None      Restrictions   Weight Bearing Restrictions  No      Balance Screen   Has the patient fallen in the past 6 months  No      [place] residence    Living Arrangements  Spouse/significant other      Prior Function   Level of Independence  Independent      Cognition   Overall Cognitive Status  Within Functional Limits for tasks assessed      Posture/Postural Control   Posture/Postural Control  No significant limitations      ROM / Strength   AROM / PROM / Strength  PROM      PROM   Overall PROM Comments  50% hip IR/ER bilateral      Flexibility   Soft Tissue Assessment /Muscle Length  yes    Hamstrings  90% bilateral      Ambulation/Gait   Gait Pattern  Within Functional Limits  Objective measurements completed on examination: See above findings.    Pelvic Floor Special Questions - 10/29/18 0001    Prior Pelvic/Prostate Exam  Yes    Are you Pregnant or attempting pregnancy?  No    Prior Pregnancies  Yes    Number of Pregnancies  1   twins   Number of Vaginal Deliveries  2    Currently Sexually Active  Yes    Is this Painful  Yes    Marinoff Scale  pain interrupts completion    Urinary Leakage  No    Urinary urgency  No    Urinary frequency  more lately    Fecal incontinence  --   constpated strains   Falling out feeling (prolapse)  No    Skin Integrity  Intact;Other    Skin Integrity other  dry    Pelvic Floor Internal Exam  pt identity confirmed and informed consent given to perform internal soft tissue assessment    Exam Type  Vaginal    Sensation  normal    Palpation  TTP Lt >Rt - OI and ischiocavernosis    Strength  weak squeeze, no lift    Strength # of reps  1    Strength # of seconds  1    Tone  high       [facility] Adult PT Treatment/Exercise  - 10/29/18 0001      Self-Care   Self-Care  Other Self-Care Comments    Other Self-Care Comments   moisture, breathing, urge to void             PT Education - 10/29/18 1209    Education Details  moisture and urge    Person(s) Educated  Patient    Methods  Explanation;Demonstration;Handout;Verbal cues;Tactile cues    Comprehension  Verbalized understanding;Returned demonstration       PT Short Term Goals - 10/29/18 1307      PT SHORT TERM GOAL #1   Title  pt ind with using moisturizers and toileting techniques    Time  4    Period  Weeks    Status  New    Target Date  11/26/18      PT SHORT TERM GOAL #2   Title  Pt will be ind with stretches and breathing for pelvic floor muscle relaxation    Time  4    Period  Weeks    Status  New    Target Date  11/26/18        PT Long Term Goals - 10/29/18 1317      PT LONG TERM GOAL #1   Title  pt will be ind with advanced HEP    Time  8    Period  Weeks    Status  New    Target Date  12/24/18      PT LONG TERM GOAL #2   Title  Pt will report 1/3 at most on Marinoff scale    Time  8    Period  Weeks    Status  New    Target Date  12/24/18      PT LONG TERM GOAL #3   Title  Pt will improve pelvic floor strength in order to experience less urinary frequency demonstrated by reporting not feeling the urge to void when her bladder is not full.    Time  8    Period  Weeks    Status  New    Target Date  12/24/18  Plan - 10/29/18 1236    Clinical Impression Statement  Pt presented to clinic today due to dyspareunia that has been ongoing for a while.  She is 2/3 on the Marinoff scale.  Pt has tight hamstrings and 50% limited hip ER/IR bilaterally.  Pt has pelvic floor weaknss of 2/5MMT and only hold for 1 sec at that strength level.  Pt is TTP Lt>Rt ischiocavernoiss and obdurator internus.  She has had increased urinary frequency and reports she has to strain to have a BM at times.  Pt has more tension in  posterior pelvic floor. Pt will benefit from skilled PT to address impairments and return to maximum functional activities.    Personal Factors and Comorbidities  Behavior Pattern;Comorbidity 3+    Comorbidities  hysterectomy, lower estrogen, chronic UTIs, birth of twins    Examination-Activity Limitations  Other;Toileting    Stability/Clinical Decision Making  Evolving/Moderate complexity    Clinical Decision Making  Low    Rehab Potential  Excellent    PT Frequency  1x / week    PT Duration  8 weeks    PT Treatment/Interventions  ADLs/Self Care Home Management;Biofeedback;Cryotherapy;Electrical Stimulation;Passive range of motion;Therapeutic exercise;Therapeutic activities;Manual techniques;Taping;Dry needling;Neuromuscular re-education;Moist Heat    PT Next Visit Plan  biofeedback and stretches, toileting technique for decreased strain    PT Home Exercise Plan  moisture and urge to void - femme fusion    Consulted and Agree with Plan of Care  Patient       Patient will benefit from skilled therapeutic intervention in order to improve the following deficits and impairments:  Increased fascial restricitons, Impaired tone, Pain, Decreased strength, Impaired flexibility, Decreased endurance  Visit Diagnosis: Muscle weakness (generalized)  Cramp and spasm     Problem List Patient Active Problem List   Diagnosis Date Noted  . Malignant neoplasm of overlapping sites of right breast in female, estrogen receptor positive ([place]) 10/15/2018  . Hx of thyroid cancer 02/16/2011  . Endometritis 02/16/2011    Jule Ser, PT 10/29/2018, 1:25 PM  [facility] [address], [place], [place], [address] Phone: [phone]   Fax:  [phone]  Name: Lisa Guzman MRN: [id number] Date of Birth: 17-May-1960

## 2018-11-12 ENCOUNTER — Other Ambulatory Visit: Payer: Self-pay

## 2018-11-12 ENCOUNTER — Ambulatory Visit: Payer: BC Managed Care – PPO | Admitting: Physical Therapy

## 2018-11-12 DIAGNOSIS — M6281 Muscle weakness (generalized): Secondary | ICD-10-CM | POA: Diagnosis not present

## 2018-11-12 DIAGNOSIS — R252 Cramp and spasm: Secondary | ICD-10-CM

## 2018-11-12 NOTE — Therapy (Signed)
[facility] [address], [place], [place], [address] Phone: [phone]   Fax:  [phone]  Physical Therapy Treatment  Patient Details  Name: Lisa Guzman MRN: [id number] Date of Birth: October 11, 1960 Referring Provider (PT): Magrinat, Virgie Dad, MD   Encounter Date: 11/12/2018  PT End of Session - 11/12/18 1716    Visit Number  2    Date for PT Re-Evaluation  12/24/18    PT Start Time  1620    PT Stop Time  1701    PT Time Calculation (min)  41 min    Activity Tolerance  Patient tolerated treatment well    Behavior During Therapy  [facility] for tasks assessed/performed       Past Medical History:  Diagnosis Date  . Anxiety   . Breast cancer, right breast ([place]) 02/16/2011   Invasive ductal carcinoma  . Endometriosis   . Hx of thyroid cancer 02/16/2011  . Hyperlipidemia   . Migraine   . Thyroid disease 1977   Thyroid cancer    Past Surgical History:  Procedure Laterality Date  . ABDOMINAL HYSTERECTOMY     TAH - BSO  . BREAST RECONSTRUCTION WITH PLACEMENT OF TISSUE EXPANDER AND FLEX HD (ACELLULAR HYDRATED DERMIS)    . BREAST SURGERY     Lumpectomy (06/2006)  Bilateral mastectomy (08/2006)  . ENDOMETRIAL ABLATION      Ablation x 3  . LAMINECTOMY  2007   L4-L5 Laminectomy  . TONSILLECTOMY AND ADENOIDECTOMY     Age 14  . [place]    There were no vitals filed for this visit.  Subjective Assessment - 11/12/18 1720    Subjective  Urge to void is helping. I need explanations of what biofeedback is.    Patient Stated Goals  Be able to have intercourse and frequent UTIs    Currently in Pain?  No/denies                       [facility] Adult PT Treatment/Exercise - 11/12/18 0001      Neuro Re-ed    Neuro Re-ed Details   resting tone 22mV; quick contract 18.8 max and moderately unclear peaks hard time relaxing in between; 10 sec hold 12.26mV; 20 sec hold  10.31mV; resting post test      Exercises   Exercises  Lumbar      Lumbar Exercises: Stretches   Figure 4 Stretch  2 reps;30 seconds    Other Lumbar Stretch Exercise  butterfly stretch      Lumbar Exercises: Supine   Other Supine Lumbar Exercises  kegel in hooklying, quick contract and hold 10 sec - 5 x      Manual Therapy   Manual therapy comments  Lt adductor STM release x 5 min and stretch to follow               PT Short Term Goals - 10/29/18 1307      PT SHORT TERM GOAL #1   Title  pt ind with using moisturizers and toileting techniques    Time  4    Period  Weeks    Status  New    Target Date  11/26/18      PT SHORT TERM GOAL #2   Title  Pt will be ind with stretches and breathing for pelvic floor muscle relaxation    Time  4    Period  Weeks  Status  New    Target Date  11/26/18        PT Long Term Goals - 10/29/18 1317      PT LONG TERM GOAL #1   Title  pt will be ind with advanced HEP    Time  8    Period  Weeks    Status  New    Target Date  12/24/18      PT LONG TERM GOAL #2   Title  Pt will report 1/3 at most on Marinoff scale    Time  8    Period  Weeks    Status  New    Target Date  12/24/18      PT LONG TERM GOAL #3   Title  Pt will improve pelvic floor strength in order to experience less urinary frequency demonstrated by reporting not feeling the urge to void when her bladder is not full.    Time  8    Period  Weeks    Status  New    Target Date  12/24/18            Plan - 11/12/18 1717    Clinical Impression Statement  Pt states using urge to void is helping. Pt with initial treatment since eval.  Biofeedback demonstrates she was able to hold contraction and did better with cues for breathing.  She had improved resting tone after performing exercises. Pt will benefit from skilled PT to work on improved endurance and muscle tone.    PT Treatment/Interventions  ADLs/Self Care Home  Management;Biofeedback;Cryotherapy;Electrical Stimulation;Passive range of motion;Therapeutic exercise;Therapeutic activities;Manual techniques;Taping;Dry needling;Neuromuscular re-education;Moist Heat    PT Next Visit Plan  biofeedback and stretches as needed, f/u on and reprint toileting technique for decreased strain, progress kegels    PT Home Exercise Plan  Access Code: [email or web address]    Consulted and Agree with Plan of Care  Patient       Patient will benefit from skilled therapeutic intervention in order to improve the following deficits and impairments:  Increased fascial restricitons, Impaired tone, Pain, Decreased strength, Impaired flexibility, Decreased endurance  Visit Diagnosis: Muscle weakness (generalized)  Cramp and spasm     Problem List Patient Active Problem List   Diagnosis Date Noted  . Malignant neoplasm of overlapping sites of right breast in female, estrogen receptor positive ([place]) 10/15/2018  . Hx of thyroid cancer 02/16/2011  . Endometritis 02/16/2011    Jule Ser, PT 11/12/2018, 5:22 PM  [facility] [address] [place], [place], [address] Phone: [phone]   Fax:  [phone]  Name: ERRICKA DIERKES MRN: [id number] Date of Birth: 1960/10/16

## 2018-11-12 NOTE — Patient Instructions (Addendum)
Toileting Techniques for Bowel Movements (Defecation) Using your belly (abdomen) and pelvic floor muscles to have a bowel movement is usually instinctive.  Sometimes people can have problems with these muscles and have to relearn proper defecation (emptying) techniques.  If you have weakness in your muscles, organs that are falling out, decreased sensation in your pelvis, or ignore your urge to go, you may find yourself straining to have a bowel movement.  You are straining if you are: . holding your breath or taking in a huge gulp of air and holding it  . keeping your lips and jaw tensed and closed tightly . turning red in the face because of excessive pushing or forcing . developing or worsening your  hemorrhoids . getting faint while pushing . not emptying completely and have to defecate many times a day  If you are straining, you are actually making it harder for yourself to have a bowel movement.  Many people find they are pulling up with the pelvic floor muscles and closing off instead of opening the anus. Due to lack pelvic floor relaxation and coordination the abdominal muscles, one has to work harder to push the feces out.  Many people have never been taught how to defecate efficiently and effectively.  Notice what happens to your body when you are having a bowel movement.  While you are sitting on the toilet pay attention to the following areas: . Jaw and mouth position . Angle of your hips   . Whether your feet touch the ground or not . Arm placement  . Spine position . Waist . Belly tension . Anus (opening of the anal canal)  An Evacuation/Defecation Plan   Here are the 4 basic points:  1. Lean forward enough for your elbows to rest on your knees 2. Support your feet on the floor or use a low stool if your feet don't touch the floor  3. Push out your belly as if you have swallowed a beach ball-you should feel a widening of your waist 4. Open and relax your pelvic floor muscles,  rather than tightening around the anus      The following conditions my require modifications to your toileting posture:  . If you have had surgery in the past that limits your back, hip, pelvic, knee or ankle flexibility . Constipation   Your healthcare practitioner may make the following additional suggestions and adjustments:  1) Sit on the toilet  a) Make sure your feet are supported. b) Notice your hip angle and spine position-most people find it effective to lean forward or raise their knees, which can help the muscles around the anus to relax  c) When you lean forward, place your forearms on your thighs for support  2) Relax suggestions a) Breath deeply in through your nose and out slowly through your mouth as if you are smelling the flowers and blowing out the candles. b) To become aware of how to relax your muscles, contracting and releasing muscles can be helpful.  Pull your pelvic floor muscles in tightly by using the image of holding back gas, or closing around the anus (visualize making a circle smaller) and lifting the anus up and in.  Then release the muscles and your anus should drop down and feel open. Repeat 5 times ending with the feeling of relaxation. c) Keep your pelvic floor muscles relaxed; let your belly bulge out. d) The digestive tract starts at the mouth and ends at the anal opening, so be   sure to relax both ends of the tube.  Place your tongue on the roof of your mouth with your teeth separated.  This helps relax your mouth and will help to relax the anus at the same time.  3) Empty (defecation) a) Keep your pelvic floor and sphincter relaxed, then bulge your anal muscles.  Make the anal opening wide.  b) Stick your belly out as if you have swallowed a beach ball. c) Make your belly wall hard using your belly muscles while continuing to breathe. Doing this makes it easier to open your anus. d) Breath out and give a grunt (or try using other sounds such as  ahhhh, shhhhh, ohhhh or grrrrrrr).  4) Finish a) As you finish your bowel movement, pull the pelvic floor muscles up and in.  This will leave your anus in the proper place rather than remaining pushed out and down. If you leave your anus pushed out and down, it will start to feel as though that is normal and give you incorrect signals about needing to have a bowel movement.   [facility] [address] [place], [place] [address] Access Code: [email or web address]  URL: [email or web address]  Date: 11/12/2018  Prepared by: Jari Favre   Exercises  Supine Figure 4 Piriformis Stretch - 3 reps - 1 sets - 30 sec hold - 1x daily - 7x weekly  Supine Pelvic Floor Contraction - 10 reps - 1 sets - 3x daily - 7x weekly

## 2018-11-19 ENCOUNTER — Encounter: Payer: BC Managed Care – PPO | Admitting: Physical Therapy

## 2018-11-26 ENCOUNTER — Other Ambulatory Visit: Payer: Self-pay

## 2018-11-26 ENCOUNTER — Encounter: Payer: Self-pay | Admitting: Physical Therapy

## 2018-11-26 ENCOUNTER — Ambulatory Visit: Payer: BC Managed Care – PPO | Attending: Oncology | Admitting: Physical Therapy

## 2018-11-26 DIAGNOSIS — M6281 Muscle weakness (generalized): Secondary | ICD-10-CM | POA: Diagnosis not present

## 2018-11-26 DIAGNOSIS — R252 Cramp and spasm: Secondary | ICD-10-CM | POA: Diagnosis present

## 2018-11-26 NOTE — Patient Instructions (Signed)
Access Code: [email or web address]  URL: [email or web address]  Date: 11/26/2018  Prepared by: Jari Favre   Exercises  Supine Figure 4 Piriformis Stretch - 3 reps - 1 sets - 30 sec hold - 1x daily - 7x weekly  Supine Lower Trunk Rotation - 5 reps - 1 sets - 10 sec hold - 1x daily - 7x weekly  Supine Pelvic Floor Contraction - 10 reps - 1 sets - 1x daily - 7x weekly  Clam with Resistance - 10 reps - 1 sets - 5 sec hold - 1x daily - 7x weekly  Supine Bridge with Pelvic Floor Contraction - 10 reps - 1 sets - 5-10 sec hold - 1x daily - 7x weekly

## 2018-11-26 NOTE — Therapy (Addendum)
[facility] [address], [place], [place], [address] Phone: [phone]   Fax:  [phone]  Physical Therapy Treatment  Patient Details  Name: Lisa Guzman MRN: [id number] Date of Birth: September 17, 1960 Referring Provider (PT): Magrinat, Virgie Dad, MD   Encounter Date: 11/26/2018  PT End of Session - 11/26/18 1732    Visit Number  3    Date for PT Re-Evaluation  12/24/18    PT Start Time  1233    PT Stop Time  1313    PT Time Calculation (min)  40 min    Activity Tolerance  Patient tolerated treatment well    Behavior During Therapy  [facility] for tasks assessed/performed       Past Medical History:  Diagnosis Date  . Anxiety   . Breast cancer, right breast ([place]) 02/16/2011   Invasive ductal carcinoma  . Endometriosis   . Hx of thyroid cancer 02/16/2011  . Hyperlipidemia   . Migraine   . Thyroid disease 1977   Thyroid cancer    Past Surgical History:  Procedure Laterality Date  . ABDOMINAL HYSTERECTOMY     TAH - BSO  . BREAST RECONSTRUCTION WITH PLACEMENT OF TISSUE EXPANDER AND FLEX HD (ACELLULAR HYDRATED DERMIS)    . BREAST SURGERY     Lumpectomy (06/2006)  Bilateral mastectomy (08/2006)  . ENDOMETRIAL ABLATION      Ablation x 3  . LAMINECTOMY  2007   L4-L5 Laminectomy  . TONSILLECTOMY AND ADENOIDECTOMY     Age 65  . [place]    There were no vitals filed for this visit.  Subjective Assessment - 11/26/18 1234    Subjective  I have made some changes but no noticeable changes.  The urge has been better.  I have to visit my mom in hospice the next two weeks.    Patient Stated Goals  Be able to have intercourse and frequent UTIs    Currently in Pain?  No/denies                       [facility] Adult PT Treatment/Exercise - 11/26/18 0001      Neuro Re-ed    Neuro Re-ed Details   biofeedback used throughout session - resting tone 39m throughout treatment  after getting cues to relax and normalized with exercise, muscle contraction improved throughout session      Lumbar Exercises: Stretches   Lower Trunk Rotation Limitations  sidlying rotation - 3 x 10 sec      Lumbar Exercises: Supine   Ab Set  10 reps   kegel x10 sec with building strength   Bridge  15 reps    Other Supine Lumbar Exercises  bent knee fall out - 10x      Lumbar Exercises: Sidelying   Clam  Right;Left;10 reps;5 seconds             PT Education - 11/26/18 1733    Education Details  Access Code: [email or web address]   Person(s) Educated  Patient    Methods  Explanation;Demonstration;Verbal cues;Tactile cues;Handout    Comprehension  Verbalized understanding;Returned demonstration       PT Short Term Goals - 10/29/18 1307      PT SHORT TERM GOAL #1   Title  pt ind with using moisturizers and toileting techniques    Time  4    Period  Weeks    Status  New  Target Date  11/26/18      PT SHORT TERM GOAL #2   Title  Pt will be ind with stretches and breathing for pelvic floor muscle relaxation    Time  4    Period  Weeks    Status  New    Target Date  11/26/18        PT Long Term Goals - 10/29/18 1317      PT LONG TERM GOAL #1   Title  pt will be ind with advanced HEP    Time  8    Period  Weeks    Status  New    Target Date  12/24/18      PT LONG TERM GOAL #2   Title  Pt will report 1/3 at most on Marinoff scale    Time  8    Period  Weeks    Status  New    Target Date  12/24/18      PT LONG TERM GOAL #3   Title  Pt will improve pelvic floor strength in order to experience less urinary frequency demonstrated by reporting not feeling the urge to void when her bladder is not full.    Time  8    Period  Weeks    Status  New    Target Date  12/24/18            Plan - 11/26/18 1313    Clinical Impression Statement  Pt did well with biofeedback today.  She was able to progress strengthening exericses and has improved muscle tone after doing  the strengthening.  Pt is still working on basic supine exercises due to needing extra attention and focus for not holding her breath.  Pt will benefit from skilled PT to work on strength and ability to return to full prior level of activities without increased pain.    PT Treatment/Interventions  ADLs/Self Care Home Management;Biofeedback;Cryotherapy;Electrical Stimulation;Passive range of motion;Therapeutic exercise;Therapeutic activities;Manual techniques;Taping;Dry needling;Neuromuscular re-education;Moist Heat    PT Next Visit Plan  f/u on exercises, internal STM if having pain with intercourse, progress exercises to quadruped and standing if able    PT Home Exercise Plan  Access Code: [email or web address]    Consulted and Agree with Plan of Care  Patient       Patient will benefit from skilled therapeutic intervention in order to improve the following deficits and impairments:  Increased fascial restricitons, Impaired tone, Pain, Decreased strength, Impaired flexibility, Decreased endurance  Visit Diagnosis: Muscle weakness (generalized)  Cramp and spasm     Problem List Patient Active Problem List   Diagnosis Date Noted  . Malignant neoplasm of overlapping sites of right breast in female, estrogen receptor positive ([place]) 10/15/2018  . Hx of thyroid cancer 02/16/2011  . Endometritis 02/16/2011    Jule Ser, PT 11/26/2018, 5:38 PM  [facility] [address], [place], [place], [address] Phone: [phone]   Fax:  [phone]  Name: Lisa Guzman MRN: [id number] Date of Birth: [birth year]  PHYSICAL THERAPY DISCHARGE SUMMARY  Visits from Start of Care: 3  Current functional level related to goals / functional outcomes: See above goals   Remaining deficits: See above   Education / Equipment: HEP  Plan: Patient agrees to discharge.  Patient goals were not met. Patient is being discharged due to the patient's  request.  ?????    Gustavus Bryant, PT 12/17/18 10:48 AM

## 2018-12-03 ENCOUNTER — Encounter: Payer: BC Managed Care – PPO | Admitting: Physical Therapy

## 2018-12-10 ENCOUNTER — Encounter: Payer: BC Managed Care – PPO | Admitting: Physical Therapy

## 2018-12-17 ENCOUNTER — Encounter: Payer: BC Managed Care – PPO | Admitting: Physical Therapy

## 2018-12-24 ENCOUNTER — Encounter: Payer: BC Managed Care – PPO | Admitting: Physical Therapy

## 2018-12-31 ENCOUNTER — Encounter: Payer: BC Managed Care – PPO | Admitting: Physical Therapy

## 2019-09-05 ENCOUNTER — Encounter: Payer: Self-pay | Admitting: Genetic Counselor

## 2019-09-17 ENCOUNTER — Other Ambulatory Visit: Payer: Self-pay | Admitting: Oncology

## 2019-10-15 ENCOUNTER — Ambulatory Visit: Payer: BC Managed Care – PPO | Admitting: Oncology

## 2019-10-16 NOTE — Progress Notes (Signed)
[facility]  [phone] [phone]) [phone]  OFFICE PROGRESS NOTE   ID: Lisa HOSELTON   DOB: October 17, 1960  MR#: [id number]  IDH#:[id number]   Patient Care Team: Aletha Halim., PA-C as PCP - General (Family Medicine) Erline Hau, MD as Consulting Physician (Plastic Surgery) Juanita Craver, MD as Consulting Physician (Gastroenterology) OTHER MD:  CHIEF COMPLAINT: Estrogen receptor positive breast cancer; s/p bilateral mastectomies  CURRENT TREATMENT: Observation   INTERVAL HISTORY: Lisa Guzman returns today for follow-up of her remote breast cancer.   She had continued on vaginal estrogen and tamoxifen to cover that, but went off and did not find that it made any difference.  She has been off tamoxifen and off estrogen now for several months.  She did participate in our pelvic health program.  She found that rather unpleasant.  She did not think she was adequately prepared for the suddenness of the very intrusive physical therapy she received.  I am going to pass that information on to our physical therapists.  She is now using hyaluronic acid pellets twice a week for vaginal lubrication and that is working better than the estrogen did previously  She had recent left wrist surgery at [facility] and that is going well.  REVIEW OF SYSTEMS: [name] has received the J&J vaccine and tolerated it well.  She walks about 5 miles daily.  She enjoys her 2 grandchildren.  Detailed review of systems today was otherwise stable.   BREAST CANCER HISTORY: From the earlier summary note:  This is a 59 years old patient from [place] who was last seen here on 09/05/2012 by Ailene Ards NP and comes for a follow up visit. She also have a known history of thyroid cancer when she was young treated with surgery and RAI. She has undergone routine mammography on an annual basis.  Her last mammogram was in March 2007.  She typically has fairly dense breasts and noted a mass in her  right breast.  She was in turn sent for a mammogram that was done at [facility].  A subsequent ultrasound done at [facility] on 11/07/05 showed an irregular mass-like hypoechoic area measuring 2.7 x 2 x 1.5 cm.  The physical exam at that time showed an area of papular nodularity in the upper outer aspect of the breast.  Biopsy took place on 11/07/05, the results of which showed features suspicious for lymphovascular space involvement by tumor.  There are no discrete tumor cells seen, but there were atypical neoplastic epithelial cells see in the lymphovascular space.  Insufficient tissue was available for prognostic panel.  A second biopsy has been done on 11/29/05.  MRI scan of both breasts done on 11/16/05 showed a solitary enhancing mass in the right subareolar area measuring 3.3 x 1.7 x 1.8 cm.  The left breast was negative.  (clinical stage IIA, T2 N0).  Ms. [name] has since undergone fairly extensive staging evaluation with CT/PET, bone scan.  The CT scan and PET scan showed postsurgical changes in the upper chest secondary to previous thyroid surgery.  Finding of subpleural nodules, right lower lobe.  Subcentimeter low-density lesion, anterior aspect of the left hepatic lobe, suggestive of cysts.  The PET scan done at the same time showed potential findings for micrometastases in the lymph nodes of the right axilla.  The tumor at the subareolar location had an SUV of 2.9.  A baseline 2D echo showed ejection fraction of 55% with normal wall motion.The patient had a right breast needle  core biopsy on 11/07/2005 which showed features highly suspicious for lymphovascular space involvement by tumor, atypical ductal hyperplasia and hyalinized fibrosis.The patient had a left breast needle core biopsy at the 11-12 o'clock position on 11/29/2005 which showed invasive mammary carcinoma, estrogen receptor 100% positive, progesterone receptor 50% positive, Ki-67 15%, HER-2/neu 2+ equivocal,  HER-2/neu by FISH showed no amplification with a ratio of 1.1.  The patient became a participant in a research study NSABP B-40 in 11/2005. The patient had neoadjuvant chemotherapy in accordance with research study protocol NSABP B-40 consisting of Taxotere/Gemzar/Avastin from 12/21/2005 through 03/08/2006 x 4 cycles with Neupogen support.  This was followed by neoadjuvant chemotherapy per B-40 research protocol consisting of Adriamycin/Cytoxan/Avastin from 03/22/2006 through 05/24/2006 x 4 cycles with Neulasta support. 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   The tumor was located immediately subjacent to the nipple and the nipple/superficial margin was involved. She has positive surgical margin that required reexcision of the nipple-areola complex. Patient then underwent right breast simple mastectomy and left breast simple mastectomy on 09/06/2006 which showed in the right breast no residual carcinoma identified, margins negative, no evidence of Paget's disease of the nipple, 2 fibroadenomas, and the left breast showed no carcinoma identified, 0.2 cm fibroadenoma, fibrocystic change associated with microcalcifications, including ductal ectasia, alteration without atypia, adenosis, and fibrosis, margins negative.  Reconstruction completed. The patient received genetic counseling and testing and comprehensive BRACAnalysis report dated 08/10/2006 showed no mutation detected in the BRCA1 or BRCA2 breast cancer genes. The patient restarted Avastin therapy in 09/2006 and it was discontinued 10/2006 due to grade 3 myalgias and arthralgias.  Avastin discontinuation ended her participation in  NSABP B40 protocol study. The patient started antiestrogen therapy with Arimidex in 11/2006.   Antiestrogen therapy was changed to Femara in 11/2007.  The patient started antiestrogen therapy with Tamoxifen in 07/2010.  Her subsequent history is as detailed below   PAST MEDICAL HISTORY: Past Medical History:  Diagnosis Date  . Anxiety   . Breast cancer, right breast ([place]) 02/16/2011   Invasive ductal carcinoma  . Endometriosis   . Hx of thyroid cancer 02/16/2011  . Hyperlipidemia   . Migraine   . Thyroid disease 1977   Thyroid cancer  History of thyroid cancer in 1977.  She underwent surgery at [facility] in [place], [place].  She apparently had a tumor in her thyroid with 5 involved lymph nodes.  She did have what sounds like external beam radiation therapy.  We do not have any records regarding the thyroid cancer.   PAST SURGICAL HISTORY: Past Surgical History:  Procedure Laterality Date  . ABDOMINAL HYSTERECTOMY     TAH - BSO  . BREAST RECONSTRUCTION WITH PLACEMENT OF TISSUE EXPANDER AND FLEX HD (ACELLULAR HYDRATED DERMIS)    . BREAST SURGERY     Lumpectomy (06/2006)  Bilateral mastectomy (08/2006)  . ENDOMETRIAL ABLATION      Ablation x 3  . LAMINECTOMY  2007   L4-L5 Laminectomy  . TONSILLECTOMY AND ADENOIDECTOMY     Age 54  . TOTAL THYROIDECTOMY  [facility]  In 2001, she underwent hysterectomy with bilateral oophorectomy for what sounds like secondary to endometriosis and complications of  bleeding.  She was found also to have fibroid tumor. She has also had laser surgery for endometriosis a number of times.  In March 2007, she had a microdiskectomy of the lumbar spine.      FAMILY HISTORY Family History  Problem Relation Age of Onset  . Tuberculosis Mother   . Tuberculosis Father   . Hypertension Father   . Cancer Father        Prostate cancer  . Cancer Maternal Grandmother        Breast cancer  . Dementia Maternal Grandmother     . Diabetes Brother   Both parents have had tuberculosis and actually met in a sanitarium.  She has a maternal grandmother who had breast cancer in  her late 9 or early nineties.  She has two brothers and one sister.  One lives in [place] and two live in [place] state. Her parents currently live in [place].   There is no other history of breast or ovarian cancer in the family. BRCA testing negative.   GYNECOLOGIC HISTORY: Gravida 1 para 2 (a set identical twins), menses at age 70, age of parity 60, status post hysterectomy and bilateral salpingo-oophorectomy at age 59, use birth control pills from ages 33 through 78, used estradiol hormone replacement therapy for 6-7 years after hysterectomy.   SOCIAL HISTORY: (Updated September 2020) Mr. and Mrs. Mirabile has been married since 48.  They have twin 26 year-old sons.  They have one grandchild on the way.  They have lived in [place] since 2006. Her husband is the Psychologist, forensic, Clinical biochemist.  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  She currently volunteers at the surgical waiting area, but has previously been a volunteer at the front desk of the [facility] breast cancer center.  In her spare time she enjoys working out, studying to speaks [place], cooking and reading.    ADVANCED DIRECTIVES: Not on file    HEALTH MAINTENANCE: Social History   Tobacco Use  . Smoking status: Former Research scientist (life sciences)  . Smokeless tobacco: Never Used  . Tobacco comment: Smoked cigaretts from ages [age]  Substance Use Topics  . Alcohol use: Yes    Alcohol/week: 7.0 standard drinks    Types: 7 Glasses of wine per week  . Drug use: No    Colonoscopy: Dr Collene Mares PAP:  Bone density: 12/28/2010, -0.4 (normal).   No Known Allergies  Current Outpatient Medications  Medication Sig Dispense Refill  . atorvastatin (LIPITOR) 20 MG tablet Take 1 tablet (20 mg total) by mouth daily.    .  cholecalciferol (VITAMIN D) 1000 UNITS tablet Take 1,000 Units by mouth daily.    . diclofenac sodium (VOLTAREN) 1 % GEL APPLY GEL TO AFFECTED AREA UP TO 4 TIMES DAILY  1  . ibuprofen (ADVIL,MOTRIN) 200 MG tablet Take 400 mg by mouth every 6 (six) hours as needed for moderate pain.    Marland Kitchen levothyroxine (SYNTHROID) 175 MCG tablet Take 1 tablet (175 mcg total) by mouth every morning.    . linaclotide (LINZESS) 145 MCG CAPS capsule Take 145 mcg by mouth daily before breakfast.    . Melatonin ER 5 MG TBCR Take 5 mg by mouth at bedtime as needed (sleep).     . RELPAX 40 MG tablet Take 40 mg by mouth every 2 (two) hours as needed for migraine.     Marland Kitchen zolpidem (AMBIEN) 10 MG tablet Take 10 mg by mouth.  No current facility-administered medications for this visit.    OBJECTIVE: White woman who appears well  Vitals:   10/17/19 0835  BP: 120/77  Pulse: 68  Resp: 17  Temp: (!) 97.2 F (36.2 C)  SpO2: 100%   Wt Readings from Last 3 Encounters:  10/17/19 137 lb 4.8 oz (62.3 kg)  10/15/18 135 lb 14.4 oz (61.6 kg)  10/13/17 145 lb 1.6 oz (65.8 kg)   Body mass index is 20.88 kg/m.    ECOG FS:1 - Symptomatic but completely ambulatory  Sclerae unicteric, EOMs intact Wearing a mask No cervical or supraclavicular adenopathy Lungs no rales or rhonchi Heart regular rate and rhythm Abd soft, nontender, positive bowel sounds MSK no focal spinal tenderness, no upper extremity lymphedema Neuro: nonfocal, well oriented, appropriate affect Breasts: Status post bilateral mastectomies with bilateral reconstruction.  There is no evidence of local recurrence.  Both axillae are benign.   LAB RESULTS: CBC    Component Value Date/Time   WBC 7.7 10/15/2018 0829   RBC 4.25 10/15/2018 0829   HGB 13.2 10/15/2018 0829   HGB 12.2 09/09/2015 1230   HCT 40.0 10/15/2018 0829   HCT 35.5 09/09/2015 1230   PLT 285 10/15/2018 0829   PLT 244 09/09/2015 1230   MCV 94.1 10/15/2018 0829   MCV 87.4 09/09/2015  1230   MCH 31.1 10/15/2018 0829   MCHC 33.0 10/15/2018 0829   RDW 12.4 10/15/2018 0829   RDW 12.9 09/09/2015 1230   LYMPHSABS 3.1 10/15/2018 0829   LYMPHSABS 2.4 09/09/2015 1230   MONOABS 0.6 10/15/2018 0829   MONOABS 0.5 09/09/2015 1230   EOSABS 0.2 10/15/2018 0829   EOSABS 0.1 09/09/2015 1230   BASOSABS 0.1 10/15/2018 0829   BASOSABS 0.0 09/09/2015 1230    CMP Latest Ref Rng & Units 10/15/2018 10/13/2017 09/26/2016  Glucose 70 - 99 mg/dL 106(H) 134(H) 89  BUN 6 - 20 mg/dL 16 13 13.7  Creatinine 0.44 - 1.00 mg/dL 0.84 0.87 0.9  Sodium 135 - 145 mmol/L 138 140 137  Potassium 3.5 - 5.1 mmol/L 5.3(H) 4.3 4.2  Chloride 98 - 111 mmol/L 106 107 -  CO2 22 - 32 mmol/L 26 25 23   Calcium 8.9 - 10.3 mg/dL 8.9 8.9 9.3  Total Protein 6.5 - 8.1 g/dL 6.7 6.7 7.3  Total Bilirubin 0.3 - 1.2 mg/dL 0.4 0.4 0.40  Alkaline Phos 38 - 126 U/L 68 71 70  AST 15 - 41 U/L 23 20 24   ALT 0 - 44 U/L 16 14 19     STUDIES: No results found.   ASSESSMENT: 59 y.o. woman:  1.  The patient has a history of thyroid cancer and underwent thyroidectomy surgery in 1977 at [facility] in [place], [place].  She had a tumor in her thyroid with 5 involved lymph nodes.  She completed external beam radiation therapy/radioactive iodine ablation.    2.  The patient had a right breast needle core biopsy on 11/07/2005 which showed features highly suspicious for lymphovascular space involvement by tumor, atypical ductal hyperplasia and hyalinized fibrosis.  3.  The patient had a bilateral breast MRI on 11/16/2005 which showed a dense enhancing pattern, bilaterally.  In the right subareolar region, there was a solitary obscured enhancing mass. The dimensions are difficult to give secondary to the patient's bilateral dense enhancing pattern.  It measured approximately 3.3 x 1.7 x 1.8 cm. in transverse, craniocaudal and anterior posterior dimensions. There were no abnormal enhancements in the left breast. No enlarged  axillary adenopathy is detected (clinical stage IIA, T2 N0).    4.  The patient had a left breast needle core biopsy at the 11-12 o'clock position on 11/29/2005 which showed invasive mammary carcinoma, estrogen receptor 100% positive, progesterone receptor 50% positive, Ki-67 15%, HER-2/neu 2+ equivocal, HER-2/neu by FISH showed no amplification with a ratio of 1.1.  5.  The patient became a participant in a research study NSABP B-40 in 11/2005.  6.  The patient had neoadjuvant chemotherapy in accordance with research study protocol NSABP B-40 consisting of Taxotere/Gemzar/Avastin from 12/21/2005 through 03/08/2006 x 4 cycles with Neupogen support.  This was followed by neoadjuvant chemotherapy per B-40 research protocol consisting of Adriamycin/Cytoxan/Avastin from 03/22/2006 through 05/24/2006 x 4 cycles with Neulasta support.  7.  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   The tumor was located immediately subjacent to the nipple and the nipple/superficial margin was involved. She has positive surgical margin that required reexcision of the nipple-areola complex.  8.  Status post right breast simple mastectomy and left breast simple mastectomy on 09/06/2006 which showed in the right breast no residual carcinoma identified, margins negative, no evidence of Paget's disease of the nipple, 2 fibroadenomas, and the left breast showed no carcinoma identified, 0.2 cm fibroadenoma, fibrocystic change associated with microcalcifications, including ductal ectasia, alteration without atypia, adenosis, and fibrosis, margins negative.  Reconstruction completed.  9.  The patient received genetic  counseling and testing and comprehensive BRACAnalysis report dated 08/10/2006 showed no mutation detected in the BRCA1 or BRCA2 breast cancer genes.  10.  The patient restarted Avastin therapy in 09/2006 and it was discontinued 10/2006 due to grade 3 myalgias and arthralgias.  Avastin discontinuation ended her participation in NSABP B40 protocol study.  11.  The patient started antiestrogen therapy with Arimidex in 11/2006.   Antiestrogen therapy was changed to Femara in 11/2007.  The patient started antiestrogen therapy with Tamoxifen in 07/2010.  12.  Vaginal dryness/dyspareunia and she uses Vagifem for that.  13. History of colon polyps: She will need another colonoscopy to be set up with Dr Collene Mares. Last one revealed 2 benign polyps and familial history of polyposis as well.    PLAN:  Lisa Guzman is now 13 years out from definitive surgery for her breast cancer with no evidence of disease recurrence.  This is very favorable.  She has successfully gone off vaginal estrogens and tamoxifen.  Accordingly at this point I feel comfortable releasing her from follow-up.  Incidentally she had a fairly negative experience with our pelvic rehab program and I will pass that information on to them.  The only question remaining is whether her silicone implants are ridged or smooth.  If they are ridged then there is that concern regarding lymphoma and she may need breast MRIs intermittently.  She will call her plastic surgeon's office to figure that out  At this point I feel comfortable releasing her to her primary care physician's care.  All she will need in terms of breast cancer follow-up is a yearly chest/breast exam.  I will be glad to see Kristyn again at any point in the future if and when the need arises but as of now are making no further routine appointments for her here  Total encounter time 25 minutes.*   Bliss Tsang, Virgie Dad, MD  10/17/19 8:53 AM Medical Oncology and Hematology [facility] [place], [place] [address] Tel. [phone]    Fax. [phone]   I, Wilburn Mylar, am acting as scribe for Dr. Virgie Dad. [place].  I, Lurline Del MD, have reviewed the above documentation for accuracy and completeness, and I agree with the above.    *Total Encounter Time as defined by the Centers for Medicare and Medicaid Services includes, in addition to the face-to-face time of a patient visit (documented in the note above) non-face-to-face time: obtaining and reviewing outside history, ordering and reviewing medications, tests or procedures, care coordination (communications with other health care professionals or caregivers) and documentation in the medical record.

## 2019-10-17 ENCOUNTER — Inpatient Hospital Stay: Payer: BC Managed Care – PPO | Attending: Oncology | Admitting: Oncology

## 2019-10-17 ENCOUNTER — Other Ambulatory Visit: Payer: Self-pay

## 2019-10-17 VITALS — BP 120/77 | HR 68 | Temp 97.2°F | Resp 17 | Ht 68.0 in | Wt 137.3 lb

## 2019-10-17 DIAGNOSIS — Z90722 Acquired absence of ovaries, bilateral: Secondary | ICD-10-CM | POA: Insufficient documentation

## 2019-10-17 DIAGNOSIS — Z170 Estrogen receptor positive status [ER+]: Secondary | ICD-10-CM | POA: Diagnosis not present

## 2019-10-17 DIAGNOSIS — Z9013 Acquired absence of bilateral breasts and nipples: Secondary | ICD-10-CM | POA: Diagnosis not present

## 2019-10-17 DIAGNOSIS — Z853 Personal history of malignant neoplasm of breast: Secondary | ICD-10-CM | POA: Diagnosis present

## 2019-10-17 DIAGNOSIS — Z90710 Acquired absence of both cervix and uterus: Secondary | ICD-10-CM | POA: Diagnosis not present

## 2019-10-17 DIAGNOSIS — C50811 Malignant neoplasm of overlapping sites of right female breast: Secondary | ICD-10-CM

## 2019-10-17 DIAGNOSIS — Z8249 Family history of ischemic heart disease and other diseases of the circulatory system: Secondary | ICD-10-CM | POA: Insufficient documentation

## 2019-10-17 DIAGNOSIS — Z85850 Personal history of malignant neoplasm of thyroid: Secondary | ICD-10-CM | POA: Diagnosis not present

## 2019-10-17 DIAGNOSIS — Z9079 Acquired absence of other genital organ(s): Secondary | ICD-10-CM | POA: Insufficient documentation

## 2019-10-17 DIAGNOSIS — Z791 Long term (current) use of non-steroidal anti-inflammatories (NSAID): Secondary | ICD-10-CM | POA: Diagnosis not present

## 2019-10-17 DIAGNOSIS — E079 Disorder of thyroid, unspecified: Secondary | ICD-10-CM | POA: Diagnosis not present

## 2019-10-17 DIAGNOSIS — E785 Hyperlipidemia, unspecified: Secondary | ICD-10-CM | POA: Diagnosis not present

## 2019-10-17 DIAGNOSIS — Z833 Family history of diabetes mellitus: Secondary | ICD-10-CM | POA: Insufficient documentation

## 2019-10-17 DIAGNOSIS — Z803 Family history of malignant neoplasm of breast: Secondary | ICD-10-CM | POA: Insufficient documentation

## 2019-10-17 DIAGNOSIS — Z87891 Personal history of nicotine dependence: Secondary | ICD-10-CM | POA: Insufficient documentation

## 2019-10-17 DIAGNOSIS — Z79899 Other long term (current) drug therapy: Secondary | ICD-10-CM | POA: Diagnosis not present

## 2019-10-18 ENCOUNTER — Telehealth: Payer: Self-pay | Admitting: Hematology and Oncology

## 2019-10-18 NOTE — Telephone Encounter (Signed)
No 9/30 los. No changes made to pt's schedule.

## 2019-11-04 ENCOUNTER — Telehealth: Payer: Self-pay

## 2019-11-04 NOTE — Telephone Encounter (Signed)
Pt called requesting most recent labs be faxed to Dr Collene Mares at [phone]. Fax confirmation received. Pt is aware and verbalizes thanks.

## 2020-03-05 ENCOUNTER — Ambulatory Visit (INDEPENDENT_AMBULATORY_CARE_PROVIDER_SITE_OTHER): Payer: Self-pay | Admitting: Plastic Surgery

## 2020-03-05 ENCOUNTER — Other Ambulatory Visit: Payer: Self-pay

## 2020-03-05 ENCOUNTER — Encounter: Payer: Self-pay | Admitting: Plastic Surgery

## 2020-03-05 VITALS — BP 137/81 | HR 83

## 2020-03-05 DIAGNOSIS — Z411 Encounter for cosmetic surgery: Secondary | ICD-10-CM

## 2020-03-05 NOTE — Progress Notes (Signed)
Patient presents for Botox treatment.  She is prior patient of Dr. Dessie Coma.  She states that he had treated her forehead and glabella along with her upper lip with Botox previously.  She was happy with the results and wants to continue with that process.  On exam she has dynamic and static lines in the upper lip forehead and glabella that would be reasonably treated with Botox.  We discussed the risks and benefits and she is interested in proceeding.  The forehead was prepped with alcohol pad and 30 units of Botox were distributed.  6 units were used for the upper lip placed along the peaks and troughs of cupids bow.  The remainder was distributed along the forehead and glabella.  She tolerated this fine.  She will make a 2-week touchup appointment if she needs it and otherwise we will see her at her next visit.  She also had some interest in the fractional laser and will provide a quote for her for that.

## 2020-03-06 NOTE — Addendum Note (Signed)
Addended by: Harl Bowie on: 03/06/2020 01:24 PM   Modules accepted: Orders

## 2020-03-18 ENCOUNTER — Ambulatory Visit: Payer: BC Managed Care – PPO | Admitting: Plastic Surgery

## 2020-09-03 ENCOUNTER — Ambulatory Visit (INDEPENDENT_AMBULATORY_CARE_PROVIDER_SITE_OTHER): Payer: Self-pay

## 2020-09-03 ENCOUNTER — Other Ambulatory Visit: Payer: Self-pay

## 2020-09-03 VITALS — BP 137/81 | Temp 98.5°F | Ht 68.0 in | Wt 137.0 lb

## 2020-09-03 DIAGNOSIS — Z719 Counseling, unspecified: Secondary | ICD-10-CM

## 2020-09-03 NOTE — Patient Instructions (Signed)
Patient will use sunscreen/moisturizer. Avoid retinol products Call for any concerns

## 2020-09-03 NOTE — Progress Notes (Signed)
Preoperative Dx: facial aging  Postoperative Dx:  same  Procedure: laser to face  Anesthesia: EMLA - applied 30 mins prior to procedure  Description of Procedure:  Patient is concerned  with her overall complexion and fine lines/wrinkles- mostly around her mouth. She recently applied had a chemical peel to her entire face- which has resulted in red/irritated  & peeling areas on her left/upper lip & left mid cheek that have not resolved. I informed her that we would have to avoid these areas, until healed. I will perform FRAX laser test to the surrounding area of her mouth today.  We can schedule a full face FRAX laser in approx 6 weeks.   she does wish to try the test area today. Photos were taken & entered into chart. Risks and complications were explained to the patient. Consent was confirmed and signed. Time out was called and all information was confirmed to be correct. The area  was prepped with alcohol and wiped dry.   The FRAX laser was set at the following:  skin -1 / suntan light -  37.0/25.0 & 4 passes The patient tolerated the procedure well and there were no complications.  Aloe applied She is reminded to protect skin with sunscreen & moisturizer & avoid retina products & no abrasive scrubs for approx. 7 to 10 days  She is reminded that she may experience redness/peeling & irritation Patient to f/u in 6 weeks

## 2020-10-19 ENCOUNTER — Other Ambulatory Visit: Payer: BC Managed Care – PPO

## 2020-11-05 ENCOUNTER — Ambulatory Visit (INDEPENDENT_AMBULATORY_CARE_PROVIDER_SITE_OTHER): Payer: BC Managed Care – PPO | Admitting: Surgical

## 2020-11-05 ENCOUNTER — Other Ambulatory Visit: Payer: Self-pay

## 2020-11-05 DIAGNOSIS — Z411 Encounter for cosmetic surgery: Secondary | ICD-10-CM

## 2020-11-05 NOTE — Progress Notes (Signed)
Preoperative Dx: facial aging   Postoperative Dx:  same   Procedure: Fraxel Ellipse laser to face   Anesthesia: EMLA - applied 35 mins prior to procedure   Description of Procedure:  Patient is concerned with her overall complexion and fine lines/wrinkles, specifically in the peri-oral area. 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 She reports no current tan. No recent chemical peels or changes in her skin care routine.  Risks and complications were explained to the patient. Consent was confirmed and signed. Time out was called and all information was confirmed to be correct. The area  was prepped with alcohol and wiped dry.    The FRAX laser was set at the following:  skin -1 / suntan light -  40/25.0 & 3 passes The patient tolerated the procedure well and there were no complications.  Aloe was applied.  She is reminded to protect skin with sunscreen & moisturizer & avoid retin-A products & no abrasive scrubs for approx. 7 to 10 days  She is reminded that she may experience redness/peeling & irritation, which is normal.  Patient to f/u in 4-6 weeks for additional treatment. We discussed taking pictures throughout the healing process to evaluate ability to increase treatment intensity at her next visit.  Recommend calling with questions or concerns.

## 2020-12-03 ENCOUNTER — Other Ambulatory Visit: Payer: Self-pay

## 2020-12-03 ENCOUNTER — Ambulatory Visit (INDEPENDENT_AMBULATORY_CARE_PROVIDER_SITE_OTHER): Payer: Self-pay | Admitting: Surgical

## 2020-12-03 DIAGNOSIS — Z411 Encounter for cosmetic surgery: Secondary | ICD-10-CM

## 2020-12-03 NOTE — Progress Notes (Addendum)
Preoperative Dx: facial aging  Postoperative Dx:  same  Procedure: Frax laser to face for skin resurfacing  Anesthesia: 4% lidocaine EMLA cream  Description of Procedure:  Risks and complications were explained to the patient. Consent was confirmed and signed. Time out was called and all information was confirmed to be correct. The area  area was prepped with alcohol and wiped dry. The Frax laser was set at Skin - 1, suntan 0, 41.6/25 & 3 passes. The face was lasered. The patient tolerated the procedure well and there were no complications. The patient is to follow up in 6 weeks.  She is reminded to protect skin with sunscreen & moisturizer & avoid retin-A products & no abrasive scrubs for approx. 7 to 10 days  She is reminded that she may experience redness/peeling & irritation, which is normal.  Post-procedure patient had typical erythema and swelling, some areas more than others. No area of concern. At her next treatment, we can plan to increase from 41.6 to about 45 or even 50 for a deeper treatment.

## 2021-01-14 ENCOUNTER — Ambulatory Visit (INDEPENDENT_AMBULATORY_CARE_PROVIDER_SITE_OTHER): Payer: Self-pay | Admitting: Surgical

## 2021-01-14 ENCOUNTER — Other Ambulatory Visit: Payer: Self-pay

## 2021-01-14 DIAGNOSIS — Z411 Encounter for cosmetic surgery: Secondary | ICD-10-CM

## 2021-01-14 NOTE — Progress Notes (Signed)
Preoperative Dx: Facial aging  Postoperative Dx:  same  Procedure: FRAX laser to face for skin resurfacing  Anesthesia: 4% lidocaine Emla cream  Description of Procedure:  Risks and complications were explained to the patient. Consent was confirmed. Time out was called and all information was confirmed to be correct. The area  area was prepped with alcohol and wiped dry. The FRAX laser was set at 47.4 J/cm2.  The FRAX laser was set at skin 1, suntan 0, 25% coverage and 3 passes the face was lasered. The patient tolerated the procedure well and there were no complications. The patient is to follow up in 4 weeks.  Post procedure patient had typical erythema.  Provided with laser balm sample as well as some sunscreen samples.  All of her questions were answered to her content.  We discussed now that she has had 3-4 laser treatments that we could continue to monitor over the next few months for response.  We did review some photos today which show some improvement in the fine lines and wrinkles around her mouth.  Pictures were obtained of the patient and placed in the chart with the patient's or guardian's permission.

## 2021-04-07 ENCOUNTER — Other Ambulatory Visit: Payer: Self-pay

## 2021-04-07 ENCOUNTER — Ambulatory Visit (INDEPENDENT_AMBULATORY_CARE_PROVIDER_SITE_OTHER): Payer: Self-pay | Admitting: Plastic Surgery

## 2021-04-07 DIAGNOSIS — Z411 Encounter for cosmetic surgery: Secondary | ICD-10-CM

## 2021-04-07 NOTE — Progress Notes (Signed)
Patient presents to discuss neuromodulator treatment.  We have done this in the past.  Last time I used a total of 30 units with 6 units in the upper lip and 24 units in the forehead and glabella.  She liked the results in the forehead and glabella.  She did not notice much change to the lip.  She wants to see if she could get additional treatment today.  She has persistent static and dynamic lines in the forehead and glabella.  I drip 24 units of Botox and injected this along the forehead and glabella after prepping with an alcohol pad.  I did 2 lines for the forehead in a standard injection pattern for the glabella.  She tolerated this fine. ? ?Regarding the upper lip we talked about the pros and cons of additional Botox treatment and ultimately decided not to pursue any further neuromodulator treatment in that area.  She has had times in the past where it has altered the function of her lip and I ultimately think that is a good plan.  She has done FRAX all and while she did notice some change she is interested in a more aggressive laser resurfacing.  We discussed the halo laser which I think she would be a good candidate for.  We will plan to get her information on that.  She thinks she be interested in doing that sometime in the fall. ?

## 2021-06-16 ENCOUNTER — Other Ambulatory Visit: Payer: Self-pay | Admitting: Student

## 2021-06-16 DIAGNOSIS — M5416 Radiculopathy, lumbar region: Secondary | ICD-10-CM

## 2021-07-01 ENCOUNTER — Ambulatory Visit
Admission: RE | Admit: 2021-07-01 | Discharge: 2021-07-01 | Disposition: A | Discharge status: Home / Self Care | Payer: BC Managed Care – PPO | Source: Ambulatory Visit | Attending: Student | Admitting: Student

## 2021-07-01 DIAGNOSIS — M5416 Radiculopathy, lumbar region: Secondary | ICD-10-CM

## 2021-07-01 MED ORDER — GADOBENATE DIMEGLUMINE 529 MG/ML IV SOLN
12.0000 mL | Freq: Once | INTRAVENOUS | Status: AC | PRN
  Administered 2021-07-01: 12 mL via INTRAVENOUS

## 2021-10-01 ENCOUNTER — Ambulatory Visit (INDEPENDENT_AMBULATORY_CARE_PROVIDER_SITE_OTHER): Payer: Self-pay | Admitting: Surgical

## 2021-10-01 DIAGNOSIS — Z411 Encounter for cosmetic surgery: Secondary | ICD-10-CM

## 2021-10-01 NOTE — Progress Notes (Signed)
Botulinum Toxin Procedure Note  Procedure: Cosmetic botulinum toxin  Pre-operative Diagnosis: Dynamic rhytides  Post-operative Diagnosis: Same  Complications:  None  Brief history: The patient desires botulinum toxin injection.  She is aware of the risks including bleeding, damage to deeper structures, asymmetry, brow ptosis, eyelid ptosis, bruising. The patient understands and wishes to proceed.  Procedure: The area was prepped with alcohol and dried with a clean gauze.  Using a clean technique the botulinum toxin was diluted with 2.5 mL of bacteriostatic saline per 100 unit vial which resulted in 4 units per 0.1 mL.  Subsequently the mixture was injected in the glabellar, forehead area with preservation of the temporal branch to the lateral eyebrow. A total of 30 Units of botulinum toxin was used. The forehead and glabellar area was injected with care to inject intramuscular only while holding pressure on the supratrochlear vessels in each area during each injection on either side of the medial corrugators. The injection proceeded vertically superiorly to the medial 2/3 of the frontalis muscle and superior 2/3 of the lateral frontalis, again with preservation of the frontal branch.  We did 2 lines for the forehead and 6 total injections in the glabellar area with 2 injections in the midline due to patient requesting increased dosing in this area due to some residual dynamic right eye is in this area after her last injection.  No complications were noted. Light pressure was held for 5 minutes. She was instructed explicitly in post-operative care.  Botox LOT:  [id number] EXP: November 2025

## 2021-10-07 ENCOUNTER — Ambulatory Visit (HOSPITAL_COMMUNITY)
Admission: EM | Admit: 2021-10-07 | Discharge: 2021-10-07 | Disposition: A | Discharge status: Home / Self Care | Payer: BC Managed Care – PPO

## 2021-10-07 DIAGNOSIS — T8130XA Disruption of wound, unspecified, initial encounter: Secondary | ICD-10-CM

## 2021-10-07 MED ORDER — BACITRACIN ZINC 500 UNIT/GM EX OINT
TOPICAL_OINTMENT | CUTANEOUS | Status: AC
  Filled 2021-10-07: qty 28.35

## 2021-10-07 NOTE — ED Triage Notes (Signed)
Patient presenting with a wound on the lower right side of the back. States 2 weeks ago today Patient had a cyst removed by her dermatologist and sutures placed. Patient had these sutures removed around 3 hours ago. As the Patient was twisting to get out of her car she felt and heard a pop. States the wound then started bleeding.   The wound on the Patient's back has re-opened. Bleeding controlled at the time wound was inspected. Wound cleaned and a loose non-stick bandage placed on the Patient's back. Providers called into triage to inspect the wound.

## 2021-10-08 ENCOUNTER — Emergency Department (HOSPITAL_COMMUNITY)
Admission: EM | Admit: 2021-10-08 | Discharge: 2021-10-08 | Disposition: A | Discharge status: Home / Self Care | Payer: BC Managed Care – PPO | Attending: Emergency Medicine | Admitting: Emergency Medicine

## 2021-10-08 ENCOUNTER — Encounter (HOSPITAL_COMMUNITY): Payer: Self-pay

## 2021-10-08 ENCOUNTER — Other Ambulatory Visit: Payer: Self-pay

## 2021-10-08 DIAGNOSIS — T8130XA Disruption of wound, unspecified, initial encounter: Secondary | ICD-10-CM | POA: Insufficient documentation

## 2021-10-08 DIAGNOSIS — Z853 Personal history of malignant neoplasm of breast: Secondary | ICD-10-CM | POA: Diagnosis not present

## 2021-10-08 NOTE — Discharge Instructions (Signed)
Please follow up with your dermatologist as recommended by their office

## 2021-10-08 NOTE — ED Triage Notes (Signed)
Patient states that she had a cystectomy on her right lower back. Patient states she had her sutures removed yesterday. Today the patient was getting out of her car and the wound opened up. Patient went to a [facility] today and states she was told she was probably allergic to the sutures.

## 2021-10-08 NOTE — ED Provider Notes (Signed)
[facility] DEPT Provider Note   CSN: [id number] Arrival date & time: 10/08/21  3846     History  Chief Complaint  Patient presents with   Wound Dehiscence    Lisa Guzman is a 61 y.o. female.  Patient presents to the hospital complaining of wound dehiscence.  Patient states that approximate 2 weeks ago she had a cystic structure removed from her right lower back.  She states that yesterday sutures were removed by urgent care.  She states that shortly after leaving she felt a pop and noticed that the wound had gaped open.  The patient states that the urgent care told her that she appeared to possibly be allergic to the sutures.  She presents today for recommendations on wound closure.  She states that she called her dermatologist office this morning who performed the procedure but that she has not heard back.  The office appear to be closed today.  Past medical history otherwise significant for right-sided breast cancer, thyroid disease, endometriosis, anxiety HPI     Home Medications Prior to Admission medications   Medication Sig Start Date End Date Taking? Authorizing Provider  ALPRAZolam Duanne Moron) 0.5 MG tablet Take 0.5 mg by mouth daily as needed. 06/24/20   [provider]  amoxicillin (AMOXIL) 500 MG capsule Take 500 mg by mouth 3 (three) times daily. 09/01/20   [provider]  atorvastatin (LIPITOR) 20 MG tablet Take 1 tablet (20 mg total) by mouth daily. 10/15/18   Magrinat, Virgie Dad, MD  cetirizine (ZYRTEC) 10 MG tablet Take 1 tablet by mouth daily.    [provider]  cholecalciferol (VITAMIN D) 1000 UNITS tablet Take 1,000 Units by mouth daily.    [provider]  diclofenac sodium (VOLTAREN) 1 % GEL APPLY GEL TO AFFECTED AREA UP TO 4 TIMES DAILY 08/21/17   [provider]  fluticasone (FLONASE) 50 MCG/ACT nasal spray Place 1 spray into both nostrils daily. 02/25/20   [provider]  ibuprofen  (ADVIL,MOTRIN) 200 MG tablet Take 400 mg by mouth every 6 (six) hours as needed for moderate pain.    [provider]  levothyroxine (SYNTHROID) 137 MCG tablet TAKE ONE TABLET DAILY. *DOSAGE CHANGE* 03/25/19   [provider]  Melatonin ER 5 MG TBCR Take 5 mg by mouth at bedtime as needed (sleep).     [provider]  Melatonin-Pyridoxine 5-10 MG TBCR Take by mouth.    [provider]  Misc Natural Products (TUMERSAID) TABS Take by mouth.    [provider]  nitrofurantoin, macrocrystal-monohydrate, (MACROBID) 100 MG capsule nitrofurantoin monohydrate/macrocrystals 100 mg capsule  TAKE 1 CAPSULE BY MOUTH EVERY DAY AS NEEDED Patient not taking: Reported on 12/03/2020 01/28/19   [provider]  RELPAX 40 MG tablet Take 40 mg by mouth every 2 (two) hours as needed for migraine.  10/19/11   [provider]  TRULANCE 3 MG TABS TAKE 1 TABLET BY MOUTH EVERY DAY FOR 90 DAYS 02/28/20   [provider]  valACYclovir (VALTREX) 500 MG tablet Take 1 tablet by mouth daily. 01/28/19   [provider]  zolpidem (AMBIEN) 10 MG tablet Take 10 mg by mouth. Patient not taking: Reported on 12/03/2020 08/17/16   [provider]      Allergies    Patient has no known allergies.    Review of Systems   Review of Systems  Skin:  Positive for wound.    Physical Exam Updated Vital Signs BP  126/87   Pulse 66   Temp 98.2 F (36.8 C) (Oral)   Resp 16   Ht '5\' 9"'$  (1.753 m)   Wt 60.3 kg   SpO2 94%   BMI 19.64 kg/m  Physical Exam HENT:     Head: Normocephalic and atraumatic.  Eyes:     Conjunctiva/sclera: Conjunctivae normal.  Cardiovascular:     Rate and Rhythm: Normal rate.  Pulmonary:     Effort: Pulmonary effort is normal. No respiratory distress.  Abdominal:     General: Abdomen is flat.  Musculoskeletal:        General: No signs of injury.     Cervical back: Normal range of motion.  Skin:    General: Skin is dry.      Comments: 2.5cm open incision noted to right lower back. No drainage noted  Neurological:     Mental Status: She is alert.  Psychiatric:        Speech: Speech normal.        Behavior: Behavior normal.     ED Results / Procedures / Treatments   Labs (all labs ordered are listed, but only abnormal results are displayed) Labs Reviewed - No data to display  EKG None  Radiology No results found.  Procedures Procedures    Medications Ordered in ED Medications - No data to display  ED Course/ Medical Decision Making/ A&P                           Medical Decision Making  Patient presented with a chief complaint of wound dehiscence.  While patient was dressing for examination, patient heard back from her dermatologist office.  Dermatologist agreed to see the patient today to assess and possibly reclose incision.  No further work-up required from an emergent perspective.  Patient discharged home to follow-up with dermatologist        Final Clinical Impression(s) / ED Diagnoses Final diagnoses:  Wound dehiscence    Rx / DC Orders ED Discharge Orders     None         Ronny Bacon 10/08/21 1516    Sherwood Gambler, MD 10/11/21 1622

## 2021-10-09 NOTE — ED Provider Notes (Signed)
[name]    CSN: [id number] Arrival date & time: 10/07/21  1720      History   Chief Complaint Chief Complaint  Patient presents with   Wound Dehiscence    HPI Lisa Guzman is a 61 y.o. female.   Pleasant 61 year old female presents today with concern of evaluation of a wound.  She had a cyst removed from her right lower back by her dermatologist 2 weeks ago.  She was there earlier today and had the sutures removed.  Patient admits that the skin is red itchy and irritated.  Patient was twisting several hours after having the sutures removed and felt the wound pop back open.  She states it bled initially, but has stopped bleeding.  She denies any significant pain to the area.  She is requesting recommendations on what to do.     Past Medical History:  Diagnosis Date   Anxiety    Breast cancer, right breast ([place]) 02/16/2011   Invasive ductal carcinoma   Endometriosis    Hx of thyroid cancer 02/16/2011   Hyperlipidemia    Migraine    Thyroid disease 1977   Thyroid cancer    Patient Active Problem List   Diagnosis Date Noted   Malignant neoplasm of overlapping sites of right breast in female, estrogen receptor positive ([place]) 10/15/2018   Hx of thyroid cancer 02/16/2011   Endometritis 02/16/2011    Past Surgical History:  Procedure Laterality Date   ABDOMINAL HYSTERECTOMY     TAH - BSO   BREAST RECONSTRUCTION WITH PLACEMENT OF TISSUE EXPANDER AND FLEX HD (ACELLULAR HYDRATED DERMIS)     BREAST SURGERY     Lumpectomy (06/2006)  Bilateral mastectomy (08/2006)   ENDOMETRIAL ABLATION      Ablation x 3   LAMINECTOMY  2007   L4-L5 Laminectomy   TONSILLECTOMY AND ADENOIDECTOMY     Age 72   TOTAL THYROIDECTOMY  [place]    OB History   No obstetric history on file.      Home Medications    Prior to Admission medications   Medication Sig Start Date End Date Taking? Authorizing Provider  ALPRAZolam Duanne Moron) 0.5 MG tablet Take 0.5  mg by mouth daily as needed. 06/24/20   [provider]  amoxicillin (AMOXIL) 500 MG capsule Take 500 mg by mouth 3 (three) times daily. 09/01/20   [provider]  atorvastatin (LIPITOR) 20 MG tablet Take 1 tablet (20 mg total) by mouth daily. 10/15/18   Magrinat, Virgie Dad, MD  cetirizine (ZYRTEC) 10 MG tablet Take 1 tablet by mouth daily.    [provider]  cholecalciferol (VITAMIN D) 1000 UNITS tablet Take 1,000 Units by mouth daily.    [provider]  diclofenac sodium (VOLTAREN) 1 % GEL APPLY GEL TO AFFECTED AREA UP TO 4 TIMES DAILY 08/21/17   [provider]  fluticasone (FLONASE) 50 MCG/ACT nasal spray Place 1 spray into both nostrils daily. 02/25/20   [provider]  ibuprofen (ADVIL,MOTRIN) 200 MG tablet Take 400 mg by mouth every 6 (six) hours as needed for moderate pain.    [provider]  levothyroxine (SYNTHROID) 137 MCG tablet TAKE ONE TABLET DAILY. *DOSAGE CHANGE* 03/25/19   [provider]  Melatonin ER 5 MG TBCR Take 5 mg by mouth at bedtime as needed (sleep).     [provider]  Melatonin-Pyridoxine 5-10 MG TBCR Take by mouth.    [provider]  Misc  Natural Products (TUMERSAID) TABS Take by mouth.    [provider]  nitrofurantoin, macrocrystal-monohydrate, (MACROBID) 100 MG capsule nitrofurantoin monohydrate/macrocrystals 100 mg capsule  TAKE 1 CAPSULE BY MOUTH EVERY DAY AS NEEDED Patient not taking: Reported on 12/03/2020 01/28/19   [provider]  RELPAX 40 MG tablet Take 40 mg by mouth every 2 (two) hours as needed for migraine.  10/19/11   [provider]  TRULANCE 3 MG TABS TAKE 1 TABLET BY MOUTH EVERY DAY FOR 90 DAYS 02/28/20   [provider]  valACYclovir (VALTREX) 500 MG tablet Take 1 tablet by mouth daily. 01/28/19   [provider]  zolpidem (AMBIEN) 10 MG tablet Take 10 mg by mouth. Patient not taking: Reported on 12/03/2020 08/17/16    [provider]    Family History Family History  Problem Relation Age of Onset   Tuberculosis Mother    Tuberculosis Father    Hypertension Father    Cancer Father        Prostate cancer   Cancer Maternal Grandmother        Breast cancer   Dementia Maternal Grandmother    Diabetes Brother     Social History Social History   Tobacco Use   Smoking status: Former   Smokeless tobacco: Never   Tobacco comments:    Smoked cigaretts from ages [age]  Vaping Use   Vaping Use: Never used  Substance Use Topics   Alcohol use: Yes    Alcohol/week: 7.0 standard drinks of alcohol    Types: 7 Glasses of wine per week   Drug use: No     Allergies   Patient has no known allergies.   Review of Systems Review of Systems As per HPI  Physical Exam Triage Vital Signs ED Triage Vitals [10/07/21 1742]  Enc Vitals Group     BP      Pulse      Resp      Temp      Temp src      SpO2      Weight      Height      Head Circumference      Peak Flow      Pain Score 0     Pain Loc      Pain Edu?      Excl. in [place]?    No data found.  Updated Vital Signs There were no vitals taken for this visit.  Visual Acuity Right Eye Distance:   Left Eye Distance:   Bilateral Distance:    Right Eye Near:   Left Eye Near:    Bilateral Near:     Physical Exam Vitals and nursing note reviewed. Exam conducted with a chaperone present.  Constitutional:      General: She is not in acute distress.    Appearance: Normal appearance. She is normal weight. She is not ill-appearing, toxic-appearing or diaphoretic.  HENT:     Head: Normocephalic.  Pulmonary:     Effort: Pulmonary effort is normal. No respiratory distress.  Skin:    General: Skin is warm and dry.     Comments: Open wound to R lower lateral back roughly 1" linear wound. Deep tissue revealed, several internal stitches noted. No active infection, no FB, no drainage. The skin surrounding the wound shows a suture  dermatitis, is red, irritated and swollen.  Neurological:     Mental Status: She is alert.      UC Treatments / Results  Labs (all labs ordered are listed, but only abnormal results are displayed) Labs Reviewed - No data to display  EKG   Radiology No results found.  Procedures Procedures (including critical care time)  Medications Ordered in UC Medications - No data to display  Initial Impression / Assessment and Plan / UC Course  I have reviewed the triage vital signs and the nursing notes.  Pertinent labs & imaging results that were available during my care of the patient were reviewed by me and considered in my medical decision making (see chart for details).     Wound dehiscence -patient's wound opened back up likely secondary to irritated skin surrounding the wound bed and a suture dermatitis.  Given the depth of the wound, I feel closure would be beneficial, however would like dermatology to further evaluate to see if additional internal stitches would be needed.  I also suspect removal of the irritated skin surrounding the wound bed may also be indicated.  For now, we will dress wound with Vaseline and cover with Telfa pad until patient can follow-up with dermatology.  If unable to contact them, had to the emergency room.   Final Clinical Impressions(s) / UC Diagnoses   Final diagnoses:  Wound dehiscence     Discharge Instructions      Given the wound being greater than 53 weeks old, and a clinical appearance of an allergic reaction to the suture material, I do not feel that reclosure of the skin would be appropriate.  Recommend follow-up with dermatology as soon as possible for further recommendations.  Keep covered with Vaseline and a Telfa pad.  Head to the ER if any new or worsening symptoms.     ED Prescriptions   None    PDMP not reviewed this encounter.   Chaney Malling, [place] 10/09/21 1712

## 2021-10-09 NOTE — Discharge Instructions (Addendum)
Given the wound being greater than 39 weeks old, and a clinical appearance of an allergic reaction to the suture material, I do not feel that reclosure of the skin would be appropriate.  Recommend follow-up with dermatology as soon as possible for further recommendations.  Keep covered with Vaseline and a Telfa pad.  Head to the ER if any new or worsening symptoms.

## 2022-02-21 ENCOUNTER — Ambulatory Visit (INDEPENDENT_AMBULATORY_CARE_PROVIDER_SITE_OTHER): Payer: Self-pay | Admitting: Surgical

## 2022-02-21 DIAGNOSIS — Z411 Encounter for cosmetic surgery: Secondary | ICD-10-CM

## 2022-02-21 NOTE — Progress Notes (Signed)
Botulinum Toxin Procedure Note  Procedure: Cosmetic botulinum toxin  Pre-operative Diagnosis: Dynamic rhytides  Post-operative Diagnosis: Same  Complications:  None  Brief history: The patient desires botulinum toxin injection.  She is aware of the risks including bleeding, damage to deeper structures, asymmetry, brow ptosis, eyelid ptosis, bruising. The patient understands and wishes to proceed.  Procedure: The area was prepped with alcohol and dried with a clean gauze.  Using a clean technique the botulinum toxin was diluted with 2.5 mL of bacteriostatic saline per 100 unit vial which resulted in 4 units per 0.1 mL.  Subsequently the mixture was injected in the glabellar, forehead area with preservation of the temporal branch to the lateral eyebrow. A total of 30 Units of botulinum toxin was used. The forehead and glabellar area was injected with care to inject intramuscular only while holding pressure on the supratrochlear vessels in each area during each injection on either side of the medial corrugators. The injection proceeded vertically superiorly to the medial 2/3 of the frontalis muscle and superior 2/3 of the lateral frontalis, again with preservation of the frontal branch.  No complications were noted. Light pressure was held for 5 minutes. She was instructed explicitly in post-operative care.  Botox LOT:  [id number] EXP:  02/2024

## 2022-08-01 ENCOUNTER — Ambulatory Visit (INDEPENDENT_AMBULATORY_CARE_PROVIDER_SITE_OTHER): Payer: Self-pay | Admitting: Plastic Surgery

## 2022-08-01 ENCOUNTER — Encounter: Payer: Self-pay | Admitting: Plastic Surgery

## 2022-08-01 DIAGNOSIS — Z719 Counseling, unspecified: Secondary | ICD-10-CM

## 2022-08-01 HISTORY — DX: Counseling, unspecified: Z71.9

## 2022-08-01 NOTE — Progress Notes (Signed)

## 2022-08-22 ENCOUNTER — Ambulatory Visit: Payer: BC Managed Care – PPO
